# Patient Record
Sex: Female | Born: 1965 | Race: White | Hispanic: Yes | Marital: Single | Smoking: Never smoker
Health system: Southern US, Community
[De-identification: ages and names within clinical notes are randomized; demographics above are authoritative.]

---

## 2003-01-31 ENCOUNTER — Emergency Department (HOSPITAL_COMMUNITY): Admission: EM | Admit: 2003-01-31 | Discharge: 2003-01-31 | Payer: Self-pay | Admitting: Emergency Medicine

## 2003-07-08 ENCOUNTER — Emergency Department (HOSPITAL_COMMUNITY): Admission: EM | Admit: 2003-07-08 | Discharge: 2003-07-08 | Payer: Self-pay | Admitting: Emergency Medicine

## 2003-09-28 ENCOUNTER — Emergency Department (HOSPITAL_COMMUNITY): Admission: EM | Admit: 2003-09-28 | Discharge: 2003-09-28 | Payer: Self-pay | Admitting: Unknown Physician Specialty

## 2003-10-09 ENCOUNTER — Inpatient Hospital Stay (HOSPITAL_COMMUNITY): Admission: AD | Admit: 2003-10-09 | Discharge: 2003-10-09 | Payer: Self-pay | Admitting: Obstetrics & Gynecology

## 2003-10-25 ENCOUNTER — Emergency Department (HOSPITAL_COMMUNITY): Admission: EM | Admit: 2003-10-25 | Discharge: 2003-10-25 | Payer: Self-pay | Admitting: Emergency Medicine

## 2004-11-16 ENCOUNTER — Ambulatory Visit (HOSPITAL_COMMUNITY): Admission: RE | Admit: 2004-11-16 | Discharge: 2004-11-16 | Payer: Self-pay | Admitting: Internal Medicine

## 2004-12-14 ENCOUNTER — Ambulatory Visit (HOSPITAL_COMMUNITY): Admission: RE | Admit: 2004-12-14 | Discharge: 2004-12-14 | Payer: Self-pay | Admitting: Internal Medicine

## 2005-06-03 ENCOUNTER — Ambulatory Visit (HOSPITAL_COMMUNITY): Admission: RE | Admit: 2005-06-03 | Discharge: 2005-06-03 | Payer: Self-pay | Admitting: Internal Medicine

## 2006-07-31 ENCOUNTER — Emergency Department (HOSPITAL_COMMUNITY): Admission: EM | Admit: 2006-07-31 | Discharge: 2006-08-01 | Payer: Self-pay | Admitting: Emergency Medicine

## 2007-01-22 IMAGING — RF DG ESOPHAGUS
7 of 10 series · 15 of 24 positions shown · non-contrast
Comparison: None.

CLINICAL DATA: Dysphagia/vomiting.
 BARIUM SWALLOW:
TECHNIQUE: The patient was studied upright and flat.  Imaging includes three per second spot films in the cervical region in the AP and lateral projections.  She was also given a 13 mm tablet.   The patient has an acute back injury and so I could not get her to Valsalva.  There are no lesions of the esophagus.  No penetration or aspiration.  There is a probable small sliding hiatal hernia.  As mentioned, because of the patient?s back injury, I could not get her to Valsalva.  Even if this is a true abnormality, there is no reflux or esophagitis.  A 13 mm barium tablet readily traverses the GE junction. 
 Motility of the esophagus is normal.

[Series 1: run · 1 of 1 slices shown (1 of 7)]
[im 1/1]
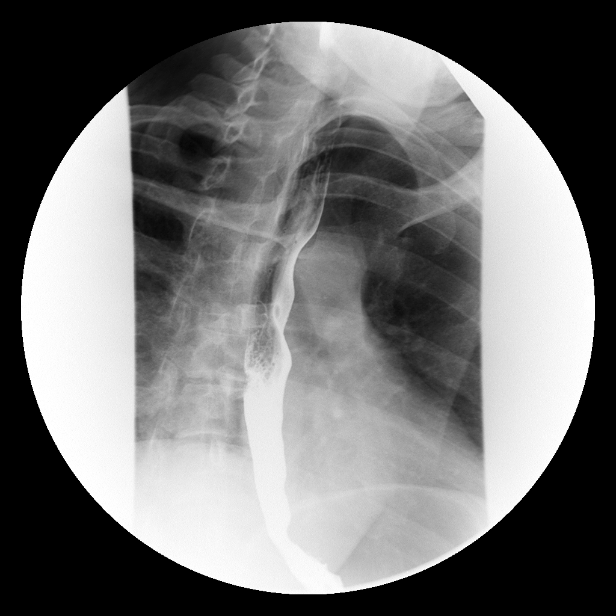

[Series 3: run · 6 of 12 slices shown (2 of 7)]
[im 1/12]
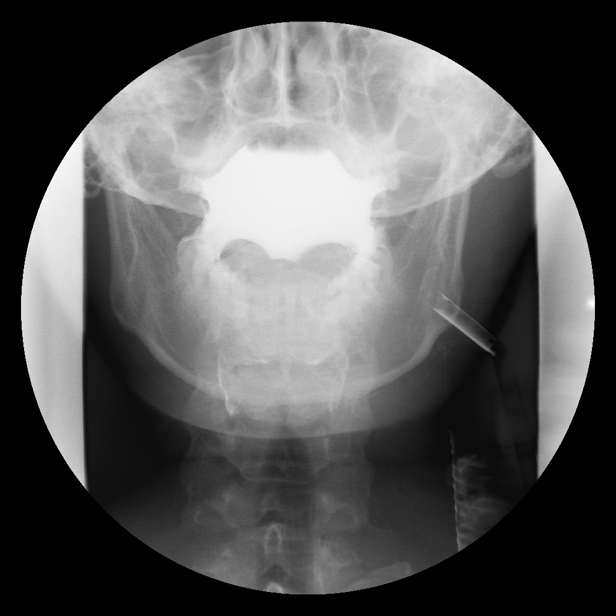
[im 3/12]
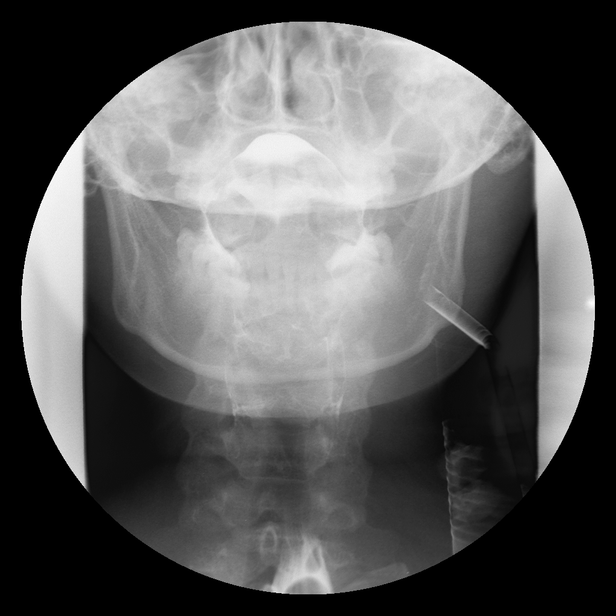
[im 4/12]
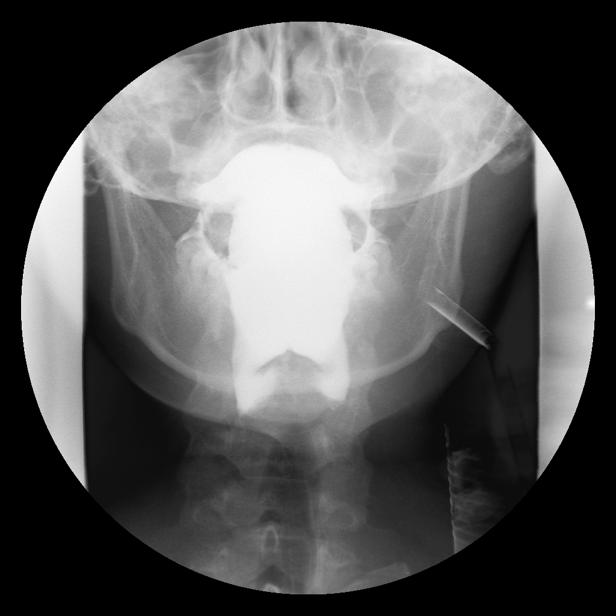
[im 7/12]
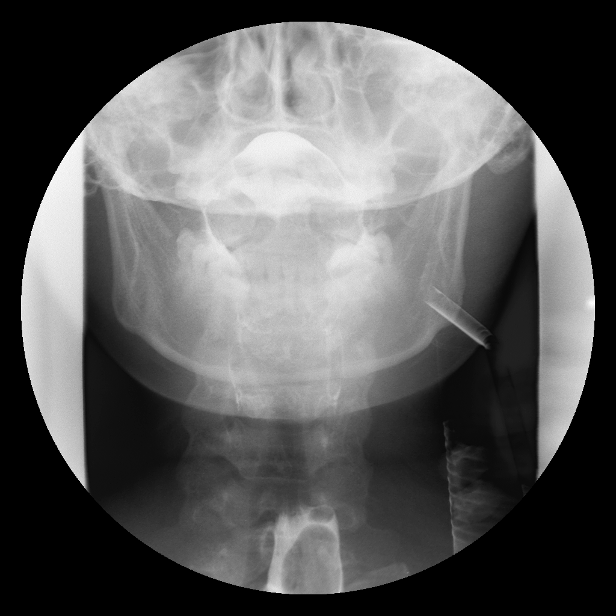
[im 8/12]
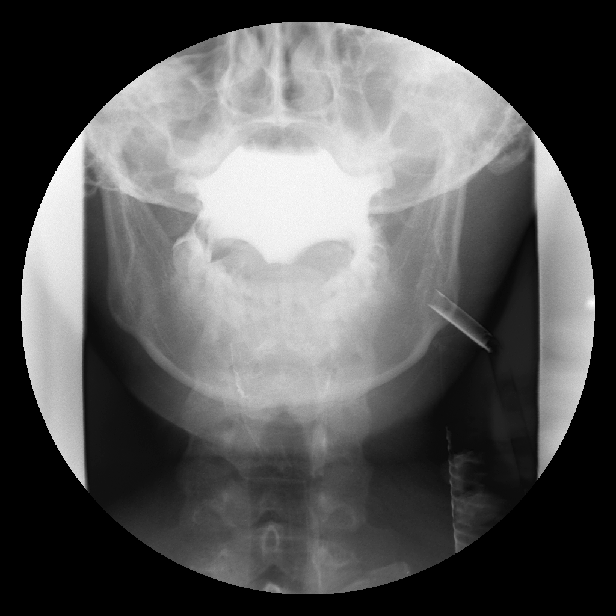
[im 10/12]
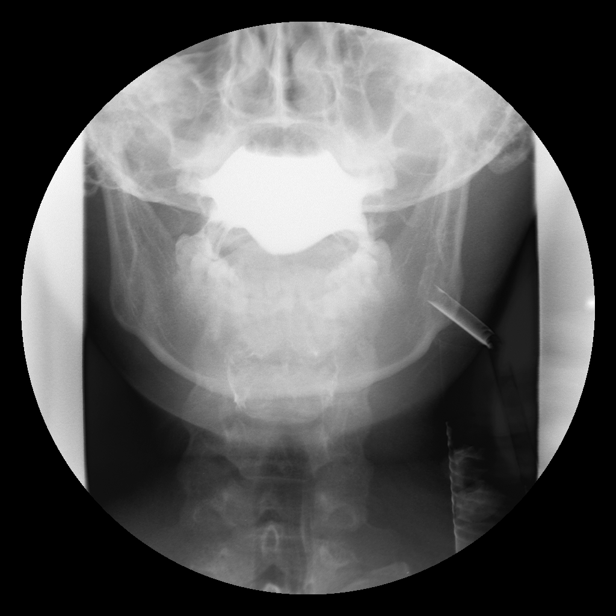

[Series 4: run · 4 of 6 slices shown (3 of 7)]
[im 1/6]
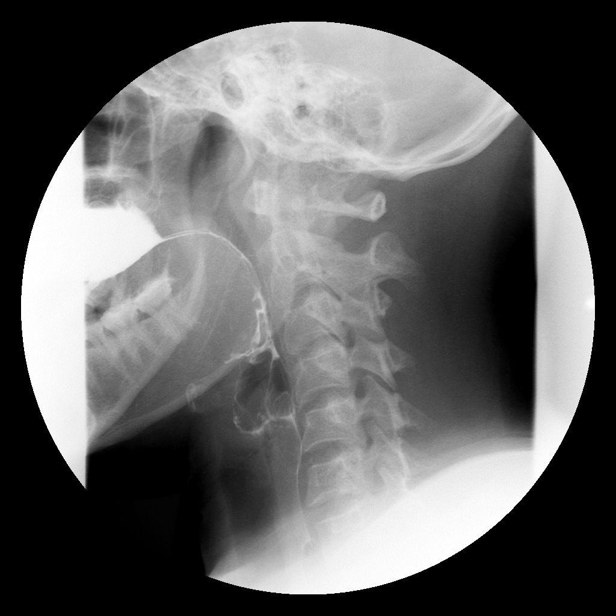
[im 2/6]
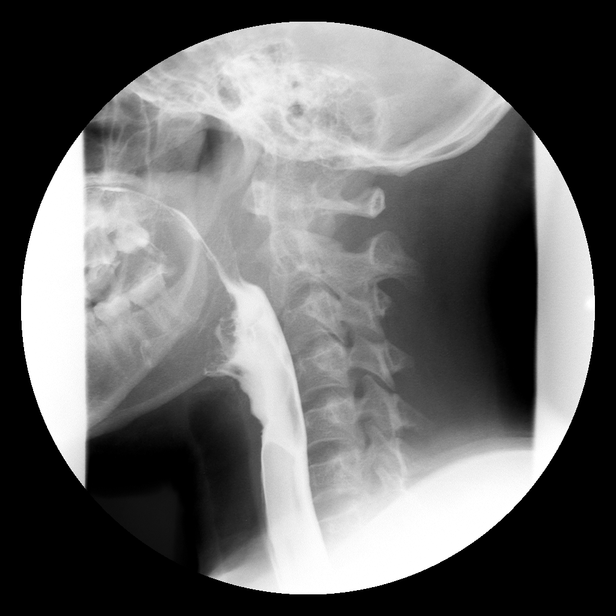
[im 4/6]
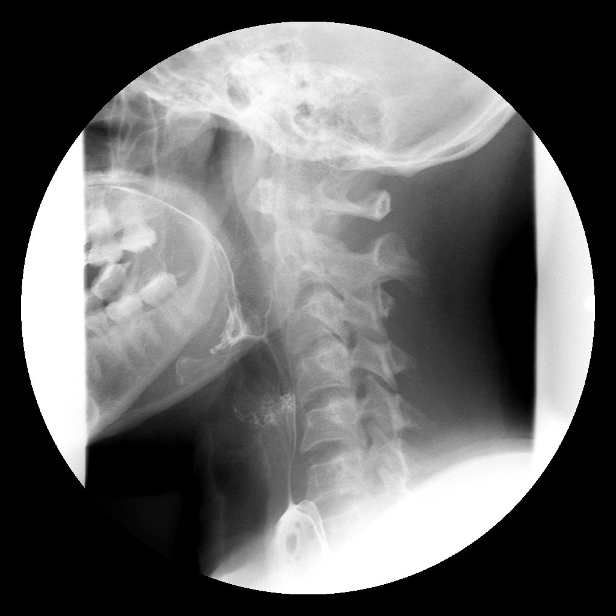
[im 5/6]
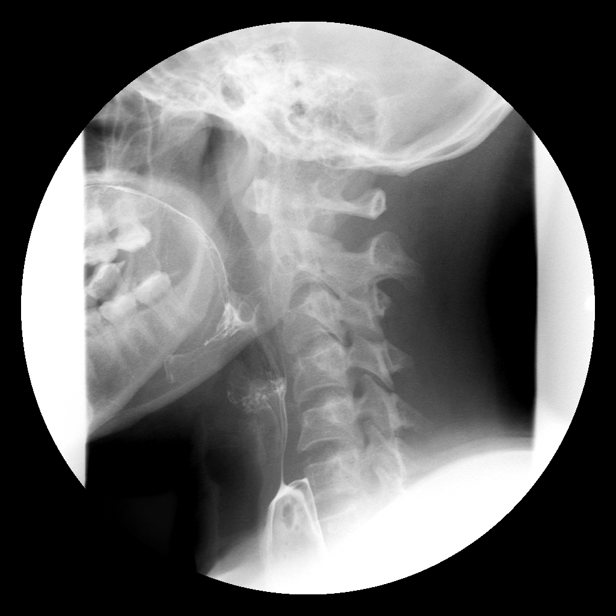

[Series 5: run · 1 of 1 slices shown (4 of 7)]
[im 1/1]
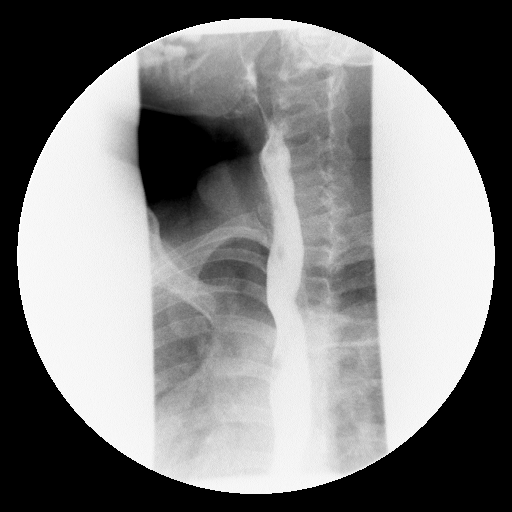

[Series 7: run · 1 of 1 slices shown (5 of 7)]
[im 1/1]
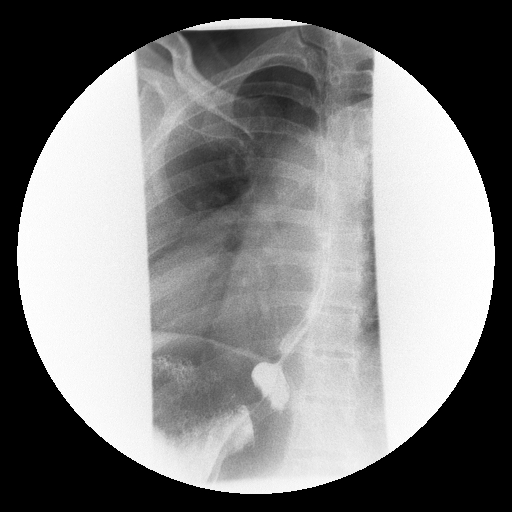

[Series 8: run · 1 of 1 slices shown (6 of 7)]
[im 1/1]
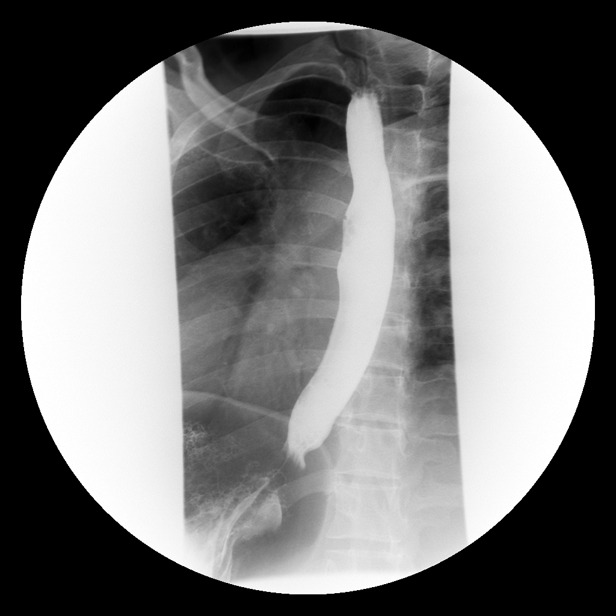

[Series 10: run · 1 of 1 slices shown (7 of 7)]
[im 1/1]
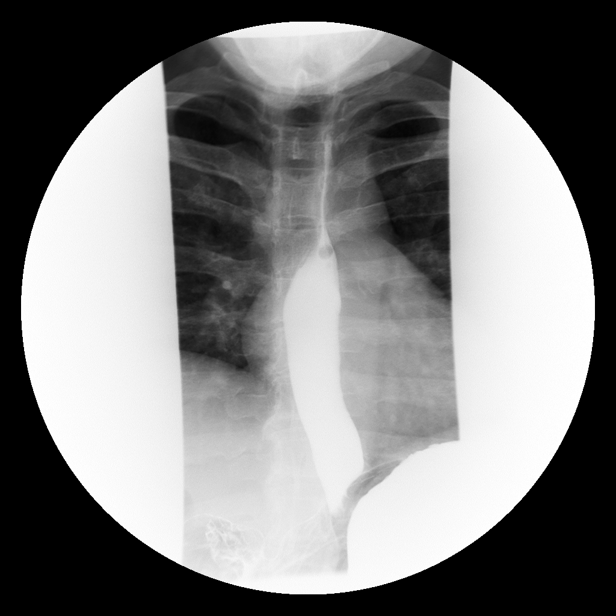

[15 of 24 positions shown; findings below may reference images not displayed]

IMPRESSION: Normal except for a possible small sliding hiatal hernia ? no reflux, esophagitis, or stricture.

## 2007-01-22 IMAGING — CR DG LUMBAR SPINE COMPLETE 4+V
5 series · 5 of 5 positions shown · non-contrast
Comparison: none

CLINICAL DATA: Nausea and vomiting.  Status post fall [REDACTED] with low back pain.
 LUMBAR LJO16-F VIEW:
 Contrast material is seen in small and large bowel compatible with patient?s esophagram earlier this same day.  This contrast partially obscures the vertebral bodies.  Vertebral body height and alignment appear maintained, however.  There is fracture of the coccyx bone.  No other abnormality.

[t l-spine a.p.]
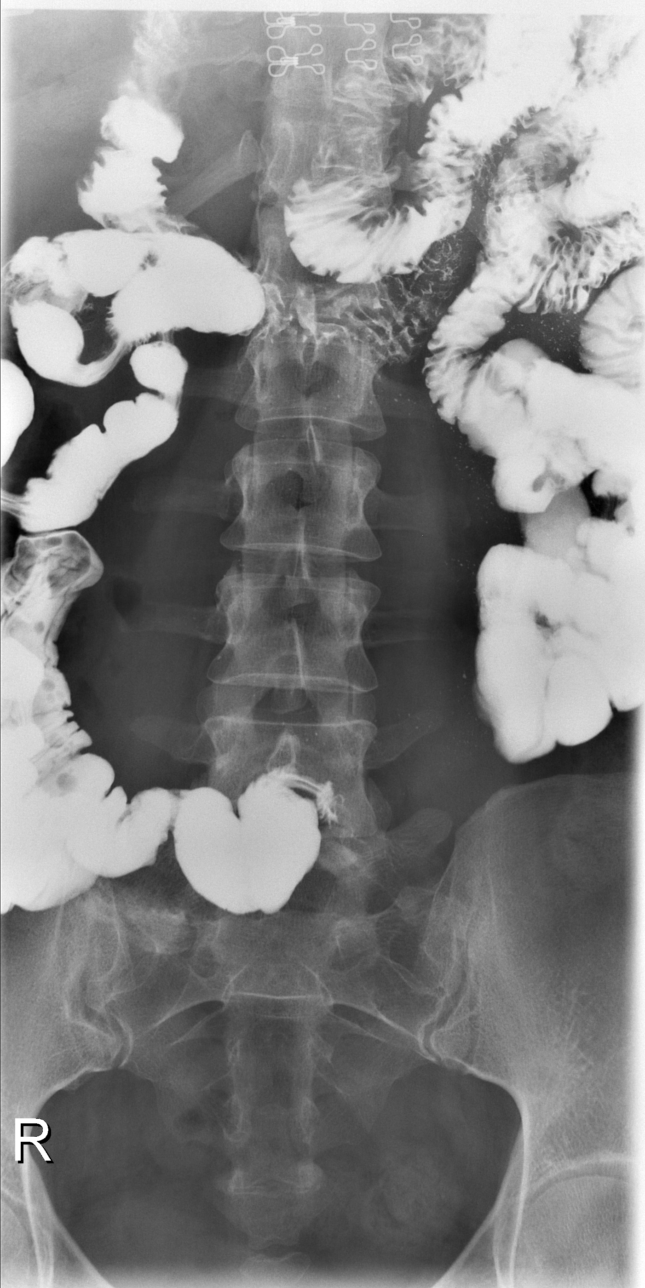

[t l-spine oblique exposure (1 of 2)]
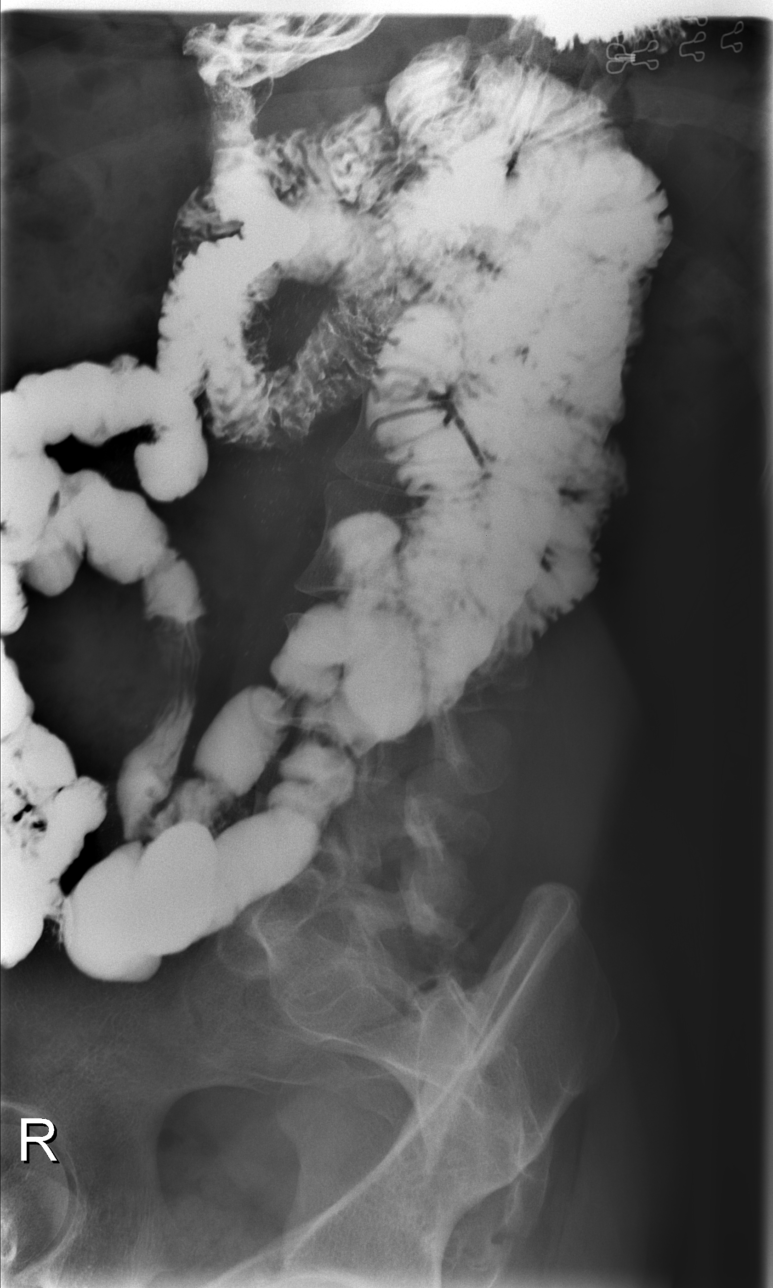

[t l-spine oblique exposure (2 of 2)]
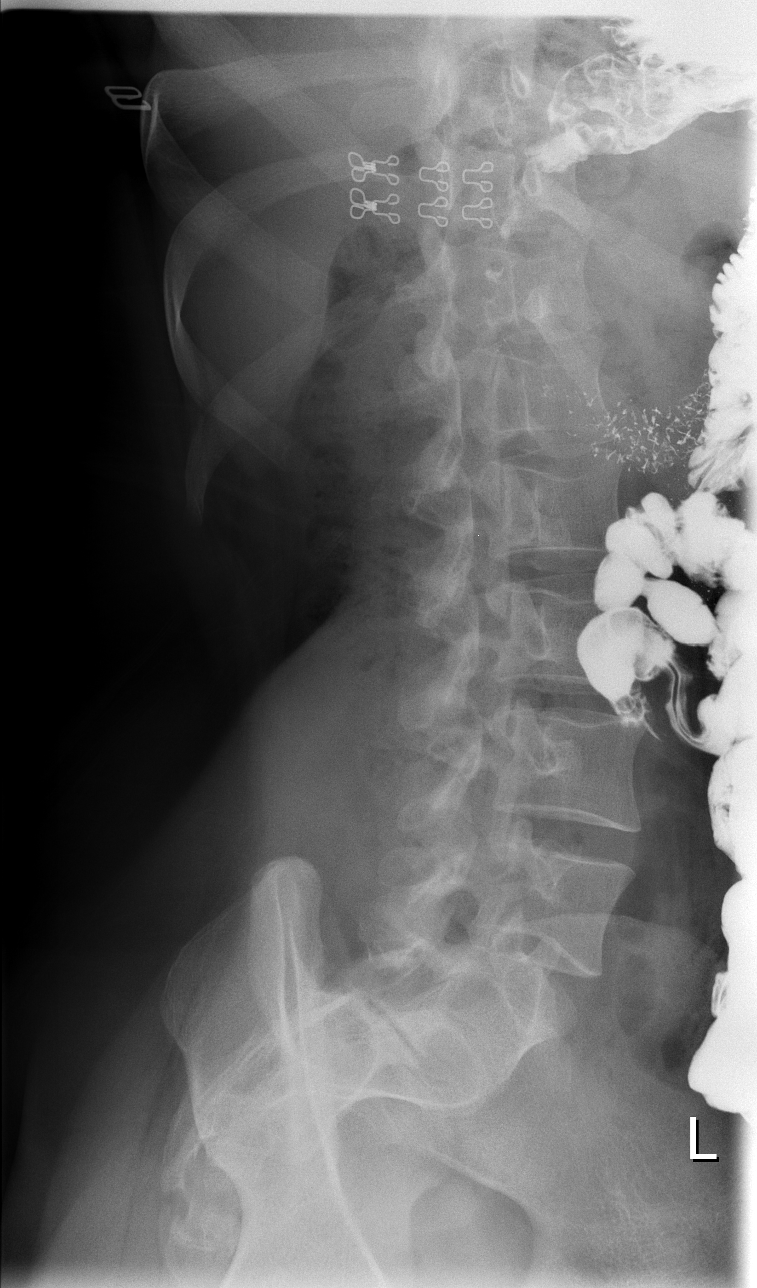

[t l-spine lat]
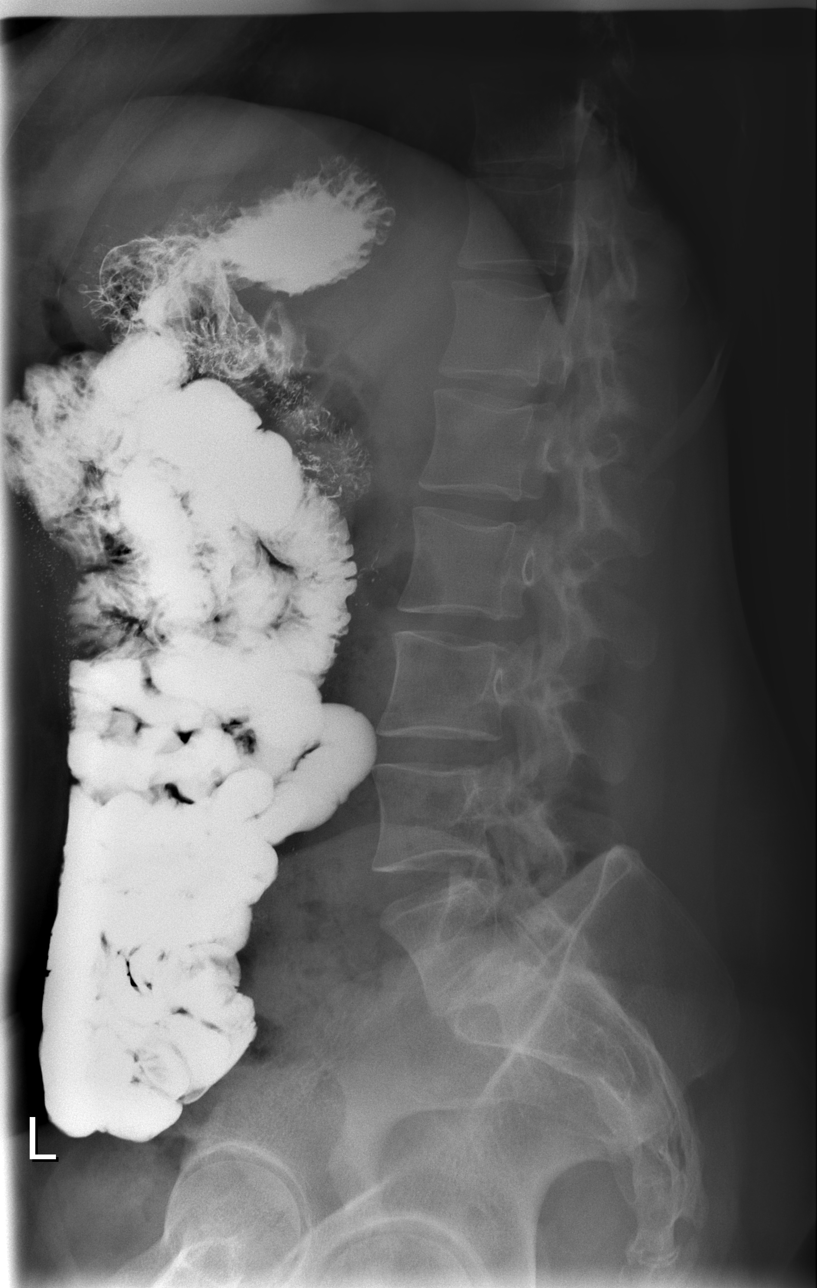

[t l-spine l5-s1 spot]
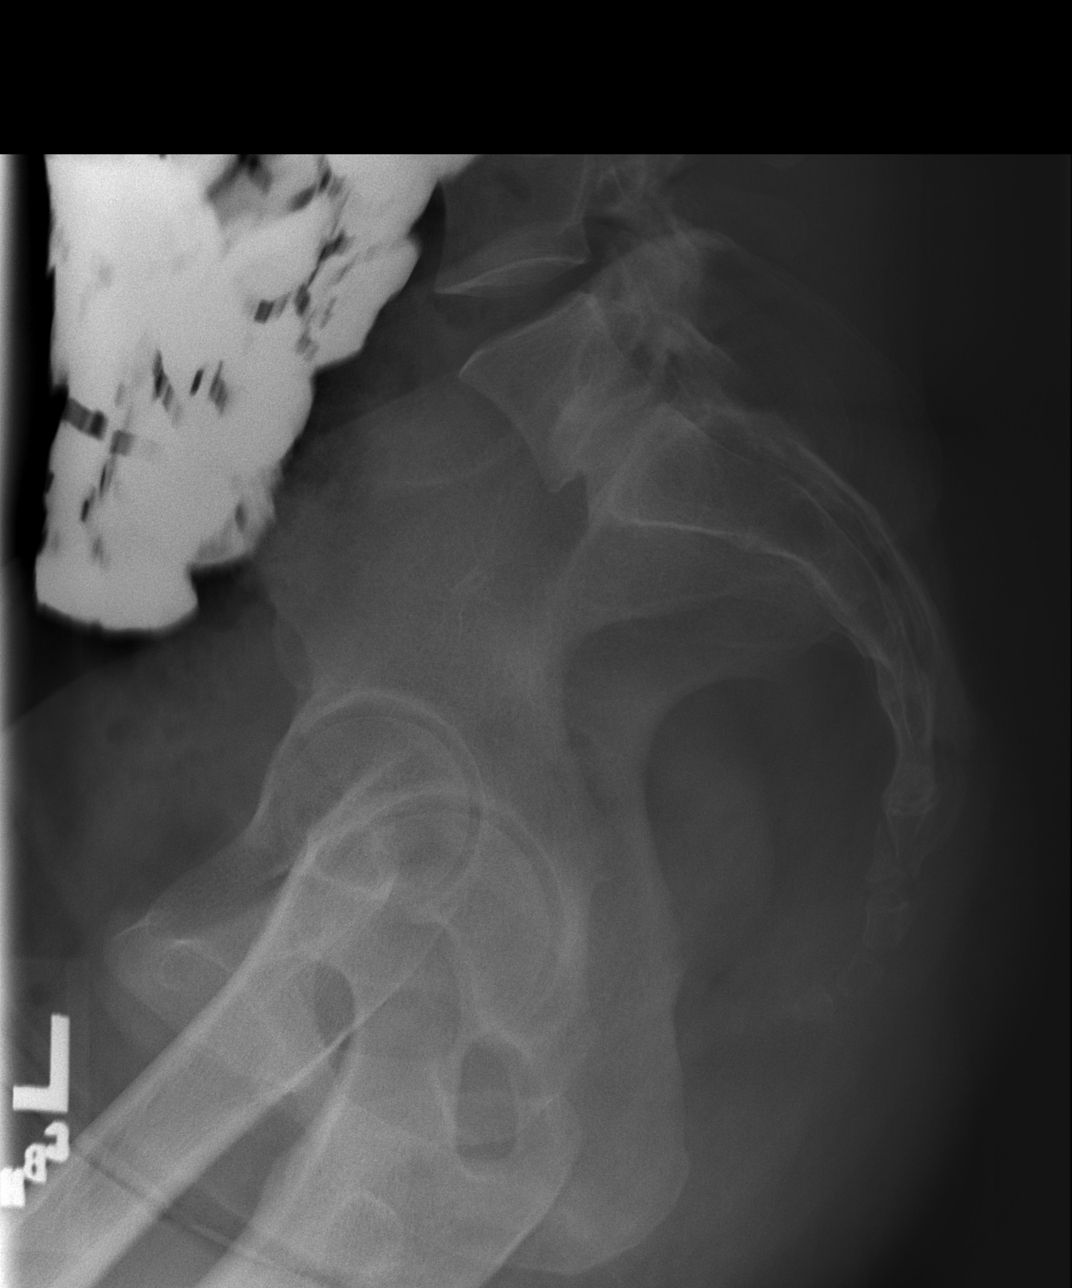

[5 of 5 positions shown; findings below may reference images not displayed]

IMPRESSION: Coccyx fracture.
 SACRUM AND COCCYX - 3 VIEW:
 The patient has fracture of the coccyx.  No other abnormality.
IMPRESSION: Coccyx fracture.

## 2008-02-06 ENCOUNTER — Encounter: Admission: RE | Admit: 2008-02-06 | Discharge: 2008-02-06 | Payer: Self-pay | Admitting: Gynecology

## 2008-02-06 ENCOUNTER — Ambulatory Visit: Payer: Self-pay | Admitting: Gynecology

## 2008-03-25 ENCOUNTER — Ambulatory Visit: Payer: Self-pay | Admitting: Gynecology

## 2008-05-14 ENCOUNTER — Ambulatory Visit: Payer: Self-pay | Admitting: Gynecology

## 2008-05-18 ENCOUNTER — Ambulatory Visit: Payer: Self-pay | Admitting: Gynecology

## 2008-05-18 ENCOUNTER — Ambulatory Visit (HOSPITAL_BASED_OUTPATIENT_CLINIC_OR_DEPARTMENT_OTHER): Admission: RE | Admit: 2008-05-18 | Discharge: 2008-05-19 | Payer: Self-pay | Admitting: Gynecology

## 2008-05-25 ENCOUNTER — Ambulatory Visit: Payer: Self-pay | Admitting: Women's Health

## 2008-06-02 ENCOUNTER — Ambulatory Visit: Payer: Self-pay | Admitting: Gynecology

## 2008-06-09 ENCOUNTER — Ambulatory Visit: Payer: Self-pay | Admitting: Gynecology

## 2008-07-20 ENCOUNTER — Ambulatory Visit: Payer: Self-pay | Admitting: Gynecology

## 2008-07-30 ENCOUNTER — Ambulatory Visit: Payer: Self-pay | Admitting: Gynecology

## 2008-08-25 ENCOUNTER — Ambulatory Visit: Payer: Self-pay | Admitting: Gynecology

## 2009-01-01 ENCOUNTER — Ambulatory Visit: Payer: Self-pay | Admitting: Gynecology

## 2010-08-22 LAB — CBC
HCT: 29.5 % — ABNORMAL LOW (ref 36.0–46.0)
Hemoglobin: 10.3 g/dL — ABNORMAL LOW (ref 12.0–15.0)
MCHC: 35 g/dL (ref 30.0–36.0)
RBC: 3.25 MIL/uL — ABNORMAL LOW (ref 3.87–5.11)
RDW: 13 % (ref 11.5–15.5)

## 2010-08-22 LAB — DIFFERENTIAL
Basophils Absolute: 0 10*3/uL (ref 0.0–0.1)
Basophils Relative: 0 % (ref 0–1)
Eosinophils Relative: 0 % (ref 0–5)
Monocytes Absolute: 0.8 10*3/uL (ref 0.1–1.0)
Monocytes Relative: 6 % (ref 3–12)
Neutro Abs: 9.8 10*3/uL — ABNORMAL HIGH (ref 1.7–7.7)

## 2010-09-20 NOTE — Op Note (Signed)
Evelyn Rodgers, Evelyn Rodgers       ACCOUNT NO.:  [id number]   MEDICAL RECORD NO.:  [id number]          PATIENT TYPE:  AMB   LOCATION:  [facility]                         FACILITY:  [facility]   PHYSICIAN:  Juan H. Lily Peer, M.D.DATE OF BIRTH:  Jan 14, 1966   DATE OF PROCEDURE:  DATE OF DISCHARGE:                               OPERATIVE REPORT   FIRST ASSISTANT:  Timothy P. Fontaine, M.D.   INDICATIONS FOR OPERATION:  A 45 year old gravida 3, para 2, AB 1 with  symptomatic second-degree cystocele and second-degree rectocele and  pelvic pressure.   PREOPERATIVE DIAGNOSES:  1. Second-degree cystocele.  2. Second-degree rectocele.   ANESTHESIA:  General endotracheal anesthesia.   FINDINGS:  Second-degree cystocele and second-degree rectocele and mild  pelvic relaxation.   PROCEDURE PERFORMED:  Anterior colporrhaphy and posterior colporrhaphy.   DESCRIPTION OF OPERATION:  After the patient was adequately counseled,  she was taken to the operating room where she underwent a successful  general endotracheal anesthesia.  She was placed in high lithotomy  position.  PSA stockings were in place for DVT prophylaxis.  The vagina  and perineum were prepped and draped in usual sterile fashion.  A Foley  catheter had been placed to monitor urinary output as well as to  determine urethrovesical angle.  A weighted speculum was placed in the  posterior vaginal vault, and a transverse-shaped incision was made in  the vaginal mucosa near the apex.  Two Allis clamps were placed for  traction, and the third Allis clamp was placed about 1 cm below the  posterior margin of the urethral meatus and pulled down.  Hemostatic  solution such as 0.5% lidocaine with 1:200,000 epinephrine was  infiltrated submucosally along the midline of the anterior vaginal wall  to decrease bleeding and to aid in dissection.  The points of a pair of  Strully scissors were used and inserted between the vaginal epithelium  in the  vaginal muscularis.  The vagina was then incised at the midline  of the incision, continued to the level of mid-urethra.  As the vagina  was incised, the edges were grasped with Allis clamps and drawn  laterally for further mobilization.  Dissection of the vaginal flaps was  then accomplished by turning the clamps back across the forefinger and  incising the vascular muscularis with a scalpel and Metzenbaum scissors  along with applying constant traction medially on the remaining vaginal  muscularis and underlying vesicovaginal adventitia.  The procedure was  performed bilaterally and a study to entire extent of the anterior  vaginal prolapse had been dissected.  The spaces lateral to the  urethrovesical junction were sharply dissected towards ichiopubic rami.  Once the vaginal flaps were completely developed, the urethrovesical  junction was identified by pulling on the Foley catheter downward until  bulb obstruct the vesicle neck.  Repair began at the urethral vesicle  junction with 2-0 Vicryl suture.  The first plicating stitch was placed  into the periurethral endopelvic fascia and tied, and 2 additional  stitches were performed at the length of the urethra and urethrovesical  junction.  After the stitches of the vesicle neck plication were placed  and tied, attention was then turned to the anterior vaginal prolapse in  a standard anterior colporrhaphy stitches using 2-0 Vicryl suture were  placed in the vaginal tissue (muscularis and adventitia) medial to the  vaginal flaps and plicated in the midline without tension.  A single row  of stitches was all that required.  The excess vaginal epithelium was  then trimmed from the flaps bilaterally, and the remaining anterior  vaginal wall was closed with a running stitch of 0-Vicryl suture.  Attention was then placed for the posterior colporrhaphy.  The weighted  speculum was removed.  The triangular incision was made into the  peritoneal  skin and the base of the triangle at the hymen.  The skin was  dissected away from the perineal body.  The vaginal epithelium was  opened in the midline extending incision to the vaginal apex.  The  posterior vaginal epithelium was dissected bilaterally away from the  underlying fiber muscularis and extended to the levators.  The posterior  vaginal wall was stripped off at the epithelium, was plicated in the  midline with interrupted sutures of 2-0 Vicryl suture incorporating the  generous purchase of the fiber muscularis.  Plication began proximally  and progressed to the hymenal ring.  The vaginal epithelium excess  tissue was excised, and the vaginal mucosa was closed with a running  stitch of 0-Vicryl suture.  The vagina was copiously irrigated with  normal saline solution. Kerlix gauze was placed into the vaginal vault  which was impregnated with Estrace cream.  The patient was extubated,  transferred to recovery with stable vital signs.  Blood loss from  procedure was recorded at 400 cc.  IV fluid of 1000 cc of lactated  Ringer's, and she did receive a gram of Cefotan, and had PSA stockings  for DVT prophylaxis.      Juan H. Lily Peer, M.D.  Electronically Signed     JHF/MEDQ  D:  05/18/2008  T:  05/19/2008  Job:  [id number]

## 2010-09-20 NOTE — H&P (Signed)
Evelyn Rodgers, Evelyn Rodgers       ACCOUNT NO.:  [id number]   MEDICAL RECORD NO.:  [id number]         PATIENT TYPE:  HAMB   LOCATION:                               FACILITY:  [facility]   PHYSICIAN:  Juan H. Lily Peer, M.D.DATE OF BIRTH:  April 18, 1966   DATE OF ADMISSION:  05/18/2008  DATE OF DISCHARGE:                              HISTORY & PHYSICAL    The patient is scheduled for surgery on Monday, January 11th at 7:30  a.m. at [facility].  Please have history and physical.   CHIEF COMPLAINT:  Second-degree cystocele, second-degree rectocele, and  pelvic pressure,.   HISTORY OF PRESENT ILLNESS:  The patient is a 45 year old gravida 3,  para 2, AB 1 who was seen in the office on October 1st as a new patient.  She had been receive her gynecological care at the [facility].  She had complaints of sensation of something falling out of  her vagina.  She denied any stress incontinence, although occasionally  she had urgency.   On examination, she was noted to have a first-to- second-degree  cystocele, second-degree rectocele, and some mild uterine descensus.  This was confirmed while the patient was in the erect position.  On  rectal examination, there appeared to be no evidence any enterocele.  We  had discussed alternative treatments for pelvic relaxation and diagram  and literature information was provided.  She was adamant of proceeding  with hysterectomy and wants to proceed with a cystocele and rectocele  repair.  She had been fully aware that if she would get pregnant in the  future that the repair could be affected and she would need another  operation.  She fully understands and accepts.   PAST MEDICAL HISTORY:  The patient has had 2 vaginal deliveries, one  miscarriage with a D&C.   ALLERGIES:  She denies any allergies.   MEDICATIONS:  She is not using any form of contraception.  Not taking  any medications.   FAMILY HISTORY:  She denies any  family history of any malignancies.   SOCIAL HISTORY:  She has 8 brothers and 3 sisters.   PHYSICAL EXAMINATION:  VITAL SIGNS:  The patient weighs 158 pounds.  She  is 5 feet 2 inches tall.  Blood pressure 118/74.  HEENT:  Unremarkable.  NECK:  Supple.  Trachea midline.  No carotid bruits.  No thyromegaly.  LUNGS:  Clear to auscultation without rhonchi or wheezes.  HEART:  Regular rate and rhythm.  No murmurs or gallop.  BREAST:  Unremarkable.  ABDOMEN:  Soft and nontender.  No rebound or guarding.  PELVIC:  Bartholin, urethra, and Skene's are within normal limits.  VAGINA:  First-to- second-degree cystocele, second-degree rectocele, and  mild uterine descensus, no enterocele.   ASSESSMENT:  A 45 year old gravida 3, para 2, AB 1, complaining of  pressure sensation like something was falling out of her vagina.  The  patient is with a first-to- second-degree cystocele, second-degree  rectocele, and mild uterine descensus, and no evidence of enterocele.  The patient is wishing to keep her uterus despite her counseling at the  age of 35.  She would like to proceed with just an anterior and  posterior colporrhaphy.  The risks, benefits, and pros and cons of the  operation were discussed to include bladder injury, infection, bleeding,  trauma to internal organs, the need for wearing a leg bag if there is  any bladder injury from her surgery needing repair, and also the event  in the near future that she were to get pregnant, that the repair could  become undone and she would need another repairing in the future.  The  patient is fully aware of this and accepts.  All the above was explained  in Spanish as well as information was given in written format.  All  questions were answered and we will follow accordingly.  The patient is  scheduled for anterior and posterior colporrhaphy on Monday, January  11th at 7:30 a.m. at [facility].  Please have history  and physical  available.      Juan H. Lily Peer, M.D.  Electronically Signed     JHF/MEDQ  D:  05/15/2008  T:  05/16/2008  Job:  [id number]

## 2013-06-14 ENCOUNTER — Emergency Department (HOSPITAL_COMMUNITY)
Admission: EM | Admit: 2013-06-14 | Discharge: 2013-06-15 | Disposition: A | Discharge status: Home / Self Care | Payer: No Typology Code available for payment source | Attending: Emergency Medicine | Admitting: Emergency Medicine

## 2013-06-14 ENCOUNTER — Other Ambulatory Visit: Payer: Self-pay

## 2013-06-14 ENCOUNTER — Encounter (HOSPITAL_COMMUNITY): Payer: Self-pay | Admitting: Emergency Medicine

## 2013-06-14 DIAGNOSIS — R42 Dizziness and giddiness: Secondary | ICD-10-CM | POA: Insufficient documentation

## 2013-06-14 DIAGNOSIS — R634 Abnormal weight loss: Secondary | ICD-10-CM | POA: Insufficient documentation

## 2013-06-14 DIAGNOSIS — R5383 Other fatigue: Secondary | ICD-10-CM

## 2013-06-14 DIAGNOSIS — Z3202 Encounter for pregnancy test, result negative: Secondary | ICD-10-CM | POA: Insufficient documentation

## 2013-06-14 DIAGNOSIS — R5381 Other malaise: Secondary | ICD-10-CM | POA: Insufficient documentation

## 2013-06-14 DIAGNOSIS — R112 Nausea with vomiting, unspecified: Secondary | ICD-10-CM | POA: Insufficient documentation

## 2013-06-14 LAB — CBC
HCT: 39.6 % (ref 36.0–46.0)
Hemoglobin: 14 g/dL (ref 12.0–15.0)
MCH: 31 pg (ref 26.0–34.0)
MCHC: 35.4 g/dL (ref 30.0–36.0)
MCV: 87.6 fL (ref 78.0–100.0)
PLATELETS: 245 10*3/uL (ref 150–400)
RBC: 4.52 MIL/uL (ref 3.87–5.11)
RDW: 12.6 % (ref 11.5–15.5)
WBC: 8.4 10*3/uL (ref 4.0–10.5)

## 2013-06-14 LAB — BASIC METABOLIC PANEL
BUN: 16 mg/dL (ref 6–23)
CHLORIDE: 100 meq/L (ref 96–112)
CO2: 25 meq/L (ref 19–32)
CREATININE: 0.62 mg/dL (ref 0.50–1.10)
Calcium: 9.8 mg/dL (ref 8.4–10.5)
GFR calc Af Amer: >90 mL/min (ref >90)
GFR calc non Af Amer: >90 mL/min (ref >90)
Glucose, Bld: 107 mg/dL — ABNORMAL HIGH (ref 70–99)
Potassium: 3.6 mEq/L — ABNORMAL LOW (ref 3.7–5.3)
Sodium: 138 mEq/L (ref 137–147)

## 2013-06-14 MED ORDER — ONDANSETRON HCL 4 MG/2ML IJ SOLN
4.0000 mg | Freq: Once | INTRAMUSCULAR | Status: AC
  Administered 2013-06-15: 4 mg via INTRAVENOUS
  Filled 2013-06-14: qty 2

## 2013-06-14 MED ORDER — PANTOPRAZOLE SODIUM 40 MG IV SOLR
40.0000 mg | Freq: Once | INTRAVENOUS | Status: AC
  Administered 2013-06-15: 40 mg via INTRAVENOUS
  Filled 2013-06-14: qty 40

## 2013-06-14 NOTE — ED Notes (Signed)
X 1 week of chest tightness. Feels like it is beating fast.

## 2013-06-14 NOTE — ED Notes (Addendum)
Patient here with complaint of fluttering feeling in chest and some tightness earlier. Also explains that she has been sick for approximately 2 weeks with vomiting and inability to tolerate anything beyond fruit juices, but states that she was able to eat some food today without vomiting. Endorses fever and chills when the symptoms started 2 weeks ago and chills earlier today, but has since felt no more. Endorses no history of heart problems.

## 2013-06-15 ENCOUNTER — Emergency Department (HOSPITAL_COMMUNITY): Payer: No Typology Code available for payment source

## 2013-06-15 LAB — HEPATIC FUNCTION PANEL
ALBUMIN: 3.9 g/dL (ref 3.5–5.2)
ALT: 12 U/L (ref 0–35)
AST: 14 U/L (ref 0–37)
Alkaline Phosphatase: 45 U/L (ref 39–117)
Bilirubin, Direct: <0.2 mg/dL (ref 0.0–0.3)
TOTAL PROTEIN: 7.6 g/dL (ref 6.0–8.3)
Total Bilirubin: 0.4 mg/dL (ref 0.3–1.2)

## 2013-06-15 LAB — LIPASE, BLOOD: LIPASE: 27 U/L (ref 11–59)

## 2013-06-15 LAB — URINALYSIS, ROUTINE W REFLEX MICROSCOPIC
Bilirubin Urine: NEGATIVE
Glucose, UA: NEGATIVE mg/dL
Ketones, ur: NEGATIVE mg/dL
LEUKOCYTES UA: NEGATIVE
NITRITE: NEGATIVE
PH: 7 (ref 5.0–8.0)
Protein, ur: NEGATIVE mg/dL
SPECIFIC GRAVITY, URINE: 1.027 (ref 1.005–1.030)
Urobilinogen, UA: 0.2 mg/dL (ref 0.0–1.0)

## 2013-06-15 LAB — URINE MICROSCOPIC-ADD ON

## 2013-06-15 LAB — PREGNANCY, URINE: PREG TEST UR: NEGATIVE

## 2013-06-15 MED ORDER — ONDANSETRON 8 MG PO TBDP: 8.0000 mg | ORAL_TABLET | Freq: Three times a day (TID) | ORAL | Status: DC | PRN

## 2013-06-15 MED ORDER — IOHEXOL 300 MG/ML  SOLN
20.0000 mL | INTRAMUSCULAR | Status: AC
  Administered 2013-06-15: 25 mL via ORAL

## 2013-06-15 MED ORDER — IOHEXOL 300 MG/ML  SOLN
100.0000 mL | Freq: Once | INTRAMUSCULAR | Status: AC | PRN
  Administered 2013-06-15: 100 mL via INTRAVENOUS

## 2013-06-15 MED ORDER — PANTOPRAZOLE SODIUM 20 MG PO TBEC: 40.0000 mg | DELAYED_RELEASE_TABLET | Freq: Every day | ORAL | Status: DC

## 2013-06-15 NOTE — Discharge Instructions (Signed)
Your workup today has not shown a specific cause for your symptoms.  Your EKG and lab work were all normal.  Your CAT scan did not show any signs of cancer, obstruction, or other abnormalities.  Take the medications as prescribed.  Please follow up with your doctor for further workup.  You may need referral to a gastroenterologist.  Return to the ER for worsening condition or new concerning symptoms.  Su estudio diagnstico hoy no ha mostrado una causa especfica para sus sntomas. Su electrocardiograma y ARAMARK Corporationanlisis de laboratorio fueron normales. Su TAC no mostr ningn signo de cncer, obstruccin y Charity fundraiserotras anomalas. Tome los medicamentos segn las indicaciones. Por favor, el seguimiento con su mdico para ms estudio diagnstico. Es posible que necesite la derivacin a Futures traderun gastroenterlogo. Regreso a la sala de urgencias por empeoramiento de la enfermedad o nuevos sntoma preocupante. Nuseas y Vmitos (Nausea and Vomiting) La nusea es la sensacin de Dentistmalestar en el estmago o de la necesidad de vomitar. El vmito es un reflejo por el que los contenidos del estmago salen por la boca. El vmito puede ocasionar prdida de lquidos del organismo (deshidratacin). Los nios y los ONEOKadultos mayores pueden deshidratarse rpidamente (en especial si tambin tienen diarrea). Las nuseas y los vmitos son sntoma de un trastorno o enfermedad. Es importante Emergency planning/management officeraveriguar la causa de los sntomas. CAUSAS  Irritacin directa de la membrana que cubre el [place]mago. Esta irritacin puede ser resultado del aumento de la produccin de cido, (reflujo gastroesofgico), infecciones, intoxicacin alimentaria, ciertos medicamentos (como antinflamatorios no esteroideos), consumo de alcohol o de tabaco.  Seales del cerebro.Estas seales pueden ser un dolor de cabeza, exposicin al calor, trastornos del odo interno, aumento de la presin en el cerebro por lesiones, infeccin, un tumor o conmocin cerebral, estmulos emocionales o  problemas metablicos.  Una obstruccin en el tracto gastrointestinal (obstruccin intestinal).  Ciertas enfermedades como la diabetes, problemas en la vescula biliar, apendicitis, problemas renales, cncer, sepsis, sntomas atpicos de infarto o trastornos alimentarios.  Tratamientos mdicos como la quimioterapia y la radiacin.  Medicamentos que inducen al sueo (anestesia general) durante Cipriano Mileuna ciruga. DIAGNSTICO  El mdico podr solicitarle algunos anlisis si los problemas no mejoran luego de 2601 Dimmitt Roadalgunos das. Tambin podrn pedirle anlisis si los sntomas son graves o si el motivo de los vmitos o las nuseas no est claro. Los American Electric Poweranlisis pueden ser:   Anlisis de [place].  Anlisis de [place].  Pruebas de materia fecal.  Cultivos (para buscar evidencias de infeccin).  Radiografas u otros estudios por imgenes. Los Norfolk Southernresultados de las pruebas lo ayudarn al mdico a tomar decisiones acerca del mejor curso de tratamiento o la necesidad de Consecoanlisis adicionales.  TRATAMIENTO  Debe estar bien hidratado. Beba con frecuencia pequeas cantidades de lquido.Puede beber agua, bebidas deportivas, caldos claros o comer pequeos trocitos de hielo o gelatina para mantenerse hidratado.Cuando coma, hgalo lentamente para evitar las nuseas.Hay medicamentos para evitar las nuseas que pueden aliviarlo.  INSTRUCCIONES PARA EL CUIDADO DOMICILIARIO  Si su mdico le prescribe medicamentos tmelos como se le haya indicado.  Si no tiene hambre, no se fuerce a comer. Sin embargo, es necesario que tome lquidos.  Si tiene hambre alimntese con una dieta normal, a menos que el mdico le indique otra cosa.  Los mejores alimentos son Neomia Dearuna combinacin de carbohidratos complejos (arroz, trigo, papas, pan), carnes magras, yogur, frutas y Sports administratorvegetales.  Evite los alimentos ricos en grasas porque dificultan la digestin.  Beba gran cantidad de lquido para mantener la orina de [place]o claro  o color amarillo  plido.  Si est deshidratado, consulte a su mdico para que le d instrucciones especficas para volver a hidratarlo. Los signos de deshidratacin son:  Franz Dell sed.  Labios y boca secos.  Mareos.  [place].  Disminucin de la frecuencia y cantidad de la [place].  Confusin.  Tiene el pulso o la respiracin acelerados. SOLICITE ATENCIN MDICA DE INMEDIATO SI:  Vomita sangre o algo similar a la borra del caf.  La materia fecal (heces) es negra o tiene [place].  Sufre una cefalea grave o rigidez en el cuello.  Se siente confundido.  Siente dolor abdominal intenso.  Tiene dolor en el pecho o dificultad para respirar.  No orina por 8 horas.  Tiene la piel fra y pegajosa.  Sigue vomitando durante ms de 24 a 48 horas.  Tiene fiebre. ASEGRESE QUE:   Comprende estas instrucciones.  Controlar su enfermedad.  Solicitar ayuda inmediatamente si no mejora o si empeora. Document Released: 05/14/2007 Document Revised: 07/17/2011 [facility] Patient Information 2014 [place], [place].

## 2013-06-15 NOTE — ED Notes (Signed)
Called CT to inform of patient completing oral contrast.

## 2013-06-15 NOTE — ED Provider Notes (Signed)
CSN: [id number]     Arrival date & time 06/14/13  2204 History   First MD Initiated Contact with Patient 06/14/13 2301     Chief Complaint  Patient presents with  . Chest Pain   (Consider location/radiation/quality/duration/timing/severity/associated sxs/prior Treatment) HPI A 48 year old female presents to emergency department from home with complaint of onset of weakness and dizziness and sense of heart palpitations started today around 4 PM.  She reports over the last 2 weeks, anytime she tries take solid foods she has vomiting.  She reports burning that persists after her episodes of vomiting.  She is able to drink fruit and vegetable smoothies without difficulties.  Patient denies any shortness of breath, no chest pain.  Patient [place] who she has not seen for these problems.  She reports that she has had an 8 pound weight loss.  No prior history of dysphasia or vomiting in the past.  She is on no medications, and denies any medical problems. History reviewed. No pertinent past medical history. History reviewed. No pertinent past surgical history. History reviewed. No pertinent family history. History  Substance Use Topics  . Smoking status: Never Smoker   . Smokeless tobacco: Not on file  . Alcohol Use: No   OB History   Grav Para Term Preterm Abortions TAB SAB Ect Mult Living                 Review of Systems  See History of Present Illness; otherwise all other systems are reviewed and negative Allergies  Review of patient's allergies indicates no known allergies.  Home Medications   Current Outpatient Rx  Name  Route  Sig  Dispense  Refill  . ibuprofen (ADVIL,MOTRIN) 200 MG tablet   Oral   Take 200-400 mg by mouth every 6 (six) hours as needed.          BP 102/62  Pulse 65  Temp(Src) 97.4 F (36.3 C) (Oral)  Resp 14  Ht 5\' 2"  (1.575 m)  Wt 162 lb (73.483 kg)  BMI 29.62 kg/m2  SpO2 100%  LMP 05/14/2013 Physical  Exam  Nursing note and vitals reviewed. Constitutional: She is oriented to person, place, and time. She appears well-developed and well-nourished. No distress.  HENT:  Head: Normocephalic and atraumatic.  Nose: Nose normal.  Mouth/Throat: Oropharynx is clear and moist.  Eyes: Conjunctivae and EOM are normal. Pupils are equal, round, and reactive to light.  Neck: Normal range of motion. Neck supple. No JVD present. No tracheal deviation present. No thyromegaly present.  Cardiovascular: Normal rate, regular rhythm, normal heart sounds and intact distal pulses.  Exam reveals no gallop and no friction rub.   No murmur heard. Pulmonary/Chest: Effort normal and breath sounds normal. No stridor. No respiratory distress. She has no wheezes. She has no rales. She exhibits no tenderness.  Abdominal: Soft. Bowel sounds are normal. She exhibits no distension and no mass. There is no tenderness. There is no rebound and no guarding.  Musculoskeletal: Normal range of motion. She exhibits no edema and no tenderness.  Lymphadenopathy:    She has no cervical adenopathy.  Neurological: She is alert and oriented to person, place, and time. She exhibits normal muscle tone. Coordination normal.  Skin: Skin is warm and dry. No rash noted. No erythema. No pallor.  Psychiatric: She has a normal mood and affect. Her behavior is normal. Judgment and thought content normal.    ED Course  Procedures (including critical  care time) Labs Review Labs Reviewed  BASIC METABOLIC PANEL - Abnormal; Notable for the following:    Potassium 3.6 (*)    Glucose, Bld 107 (*)    All other components within normal limits  URINALYSIS, ROUTINE W REFLEX MICROSCOPIC - Abnormal; Notable for the following:    Color, Urine STRAW (*)    Hgb urine dipstick TRACE (*)    All other components within normal limits  URINE MICROSCOPIC-ADD ON - Abnormal; Notable for the following:    Squamous Epithelial / LPF FEW (*)    All other components  within normal limits  CBC  PREGNANCY, URINE  HEPATIC FUNCTION PANEL  LIPASE, BLOOD   Imaging Review Ct Abdomen Pelvis W Contrast  06/15/2013   CLINICAL DATA:  48 year old female with abdominal and pelvic pain and vomiting.  EXAM: CT ABDOMEN AND PELVIS WITH CONTRAST  TECHNIQUE: Multidetector CT imaging of the abdomen and pelvis was performed using the standard protocol following bolus administration of intravenous contrast.  CONTRAST:  OMNIPAQUE IOHEXOL 300 MG/ML  SOLN  COMPARISON:  None.  FINDINGS: The lung bases are clear.  The liver, gallbladder, spleen, adrenal glands, pancreas and kidneys are unremarkable.  There is no evidence of free fluid, enlarged lymph nodes, biliary dilation or abdominal aortic aneurysm.  The bowel, bladder and appendix are unremarkable. There is no evidence of bowel obstruction, abscess or pneumoperitoneum.  The uterus and adnexal regions are within normal limits.  No acute or suspicious bony abnormalities are present.  IMPRESSION: No acute or significant abnormalities.   Electronically Signed   By: Laveda Abbe M.D.   On: 06/15/2013 01:25    EKG Interpretation   None      Date: 06/14/2013  Rate: 68  Rhythm: normal sinus rhythm  QRS Axis: normal  Intervals: normal  ST/T Wave abnormalities: nonspecific T wave changes  Conduction Disutrbances:none  Narrative Interpretation:   Old EKG Reviewed: unchanged    MDM   1. Nausea and vomiting    48 year old female with nausea and vomiting with food intake over the last 2 weeks.  She hasn't palpation severe.  No abnormalities noted on EKG.  Labs are unremarkable.  Plan to check CT abdomen pelvis for possible gastric outlet obstruction.  She'll need further workup from her primary care doctor with upper endoscopy.  Maybe due to gastritis, will start her on Protonix.    Olivia Mackie, MD 06/15/13 [phone]

## 2013-06-16 LAB — POCT I-STAT TROPONIN I: Troponin i, poc: 0 ng/mL (ref 0.00–0.08)

## 2015-01-19 ENCOUNTER — Encounter: Payer: Self-pay | Admitting: Gynecology

## 2015-01-19 ENCOUNTER — Other Ambulatory Visit (HOSPITAL_COMMUNITY)
Admission: RE | Admit: 2015-01-19 | Discharge: 2015-01-19 | Disposition: A | Discharge status: Home / Self Care | Payer: Commercial Managed Care - PPO | Source: Ambulatory Visit | Attending: Gynecology | Admitting: Gynecology

## 2015-01-19 ENCOUNTER — Ambulatory Visit (INDEPENDENT_AMBULATORY_CARE_PROVIDER_SITE_OTHER): Payer: Commercial Managed Care - PPO | Admitting: Gynecology

## 2015-01-19 VITALS — BP 122/76 | Ht 62.0 in | Wt 164.0 lb

## 2015-01-19 DIAGNOSIS — Z01419 Encounter for gynecological examination (general) (routine) without abnormal findings: Secondary | ICD-10-CM

## 2015-01-19 DIAGNOSIS — B373 Candidiasis of vulva and vagina: Secondary | ICD-10-CM | POA: Diagnosis not present

## 2015-01-19 DIAGNOSIS — Z8639 Personal history of other endocrine, nutritional and metabolic disease: Secondary | ICD-10-CM

## 2015-01-19 DIAGNOSIS — R3129 Other microscopic hematuria: Secondary | ICD-10-CM

## 2015-01-19 DIAGNOSIS — R312 Other microscopic hematuria: Secondary | ICD-10-CM | POA: Diagnosis not present

## 2015-01-19 DIAGNOSIS — Z1151 Encounter for screening for human papillomavirus (HPV): Secondary | ICD-10-CM | POA: Insufficient documentation

## 2015-01-19 DIAGNOSIS — R8290 Unspecified abnormal findings in urine: Secondary | ICD-10-CM | POA: Diagnosis not present

## 2015-01-19 DIAGNOSIS — B3731 Acute candidiasis of vulva and vagina: Secondary | ICD-10-CM

## 2015-01-19 LAB — URINALYSIS W MICROSCOPIC + REFLEX CULTURE
BILIRUBIN URINE: NEGATIVE
CASTS: NONE SEEN [LPF]
CRYSTALS: NONE SEEN [HPF]
Glucose, UA: NEGATIVE
KETONES UR: NEGATIVE
Leukocytes, UA: NEGATIVE
Nitrite: NEGATIVE
PROTEIN: NEGATIVE
Specific Gravity, Urine: 1.01 (ref 1.001–1.035)
pH: 6 (ref 5.0–8.0)

## 2015-01-19 MED ORDER — FLUCONAZOLE 150 MG PO TABS: 150.0000 mg | ORAL_TABLET | Freq: Once | ORAL | Status: DC

## 2015-01-19 NOTE — Progress Notes (Signed)
Evelyn Rodgers Aug 28, 1965 [id number]   History:    49 y.o.  for annual gyn exam who has not been seen the office for over 7 years. She states she was seen her primary care physician in [facility] who sometime this year did her blood work. She mentioned that he had diagnosed her with vitamin D deficiency and she's taking 50,000 units weekly for approximately 3-4 months and is scheduled to follow-up to check her vitamin D level after she completes the recommended treatment. She mentioned that her primary care physician had noted microhematuria in the past and had referred her to the urologist at [facility] but patient did not follow through. She was having some frequency with urination and some odor today but no dysuria. Patient denied any fever, chills, nausea, vomiting or any back pain. She is using condoms for contraception at times she reports normal menstrual cycles. Patient denies any past history of any abnormal Pap smear. Patient reports normal mammogram this year.  Past medical history,surgical history, family history and social history were all reviewed and documented in the EPIC chart.  Gynecologic History Patient's last menstrual period was 01/03/2015. Contraception: condoms Last Pap: More than 5 years ago. Results were: normal Last mammogram: 2016. Results were: Patient reports it was normal  Obstetric History OB History  Gravida Para Term Preterm AB SAB TAB Ectopic Multiple Living  # Outcome Date GA Lbr Len/2nd Weight Sex Delivery Anes PTL Lv  2 Para           1 Para                ROS: A ROS was performed and pertinent positives and negatives are included in the history.  GENERAL: No fevers or chills. HEENT: No change in vision, no earache, sore throat or sinus congestion. NECK: No pain or stiffness. CARDIOVASCULAR: No chest pain or pressure. No palpitations. PULMONARY: No shortness of breath, cough or wheeze.  GASTROINTESTINAL: No abdominal pain, nausea, vomiting or diarrhea, melena or bright red blood per rectum. GENITOURINARY: No urinary frequency, urgency, hesitancy or dysuria. MUSCULOSKELETAL: No joint or muscle pain, no back pain, no recent trauma. DERMATOLOGIC: No rash, no itching, no lesions. ENDOCRINE: No polyuria, polydipsia, no heat or cold intolerance. No recent change in weight. HEMATOLOGICAL: No anemia or easy bruising or bleeding. NEUROLOGIC: No headache, seizures, numbness, tingling or weakness. PSYCHIATRIC: No depression, no loss of interest in normal activity or change in sleep pattern.     Exam: chaperone present  BP 122/76 mmHg  Ht  (1.575 m)  Wt 164 lb (74.39 kg)  BMI 29.99 kg/m2  LMP 01/03/2015  Body mass index is 29.99 kg/(m^2).  General appearance : Well developed well nourished female. No acute distress HEENT: Eyes: no retinal hemorrhage or exudates,  Neck supple, trachea midline, no carotid bruits, no thyroidmegaly Lungs: Clear to auscultation, no rhonchi or wheezes, or rib retractions  Heart: Regular rate and rhythm, no murmurs or gallops Breast:Examined in sitting and supine position were symmetrical in appearance, no palpable masses or tenderness,  no skin retraction, no nipple inversion, no nipple discharge, no skin discoloration, no axillary or supraclavicular lymphadenopathy Abdomen: no palpable masses or tenderness, no rebound or guarding Extremities: no edema or skin discoloration or tenderness  Pelvic:  Bartholin, Urethra, Skene Glands: Within normal limits             Vagina: No gross  lesions or discharge  Cervix: No gross lesions or discharge  Uterus  anteverted, normal size, shape and consistency, non-tender and mobile  Adnexa  Without masses or tenderness  Anus and perineum  normal   Rectovaginal  normal sphincter tone without palpated masses or tenderness             Hemoccult not indicated     Assessment/Plan:  49 y.o. female for annual exam  will have her Pap smear with HPV screening today. Her urinalysis demonstrated the following:  10-20 RBC Few bacteria 0-5 WBC  We'll wait for the results of the urine culture of the urine culture is negative I have asked the patient to return back next week for urinalysis. If microhematuria still present were going to refer her to a local urologist for further evaluation. Blood work was done recently by her PCP. There was some yeast noted in the urine sign point to call in a prescription for Diflucan 150 mg one by mouth. We discussed importance of monthly self breast examination. Patient declined flu vaccine.   Ok Edwards MD, 5:18 PM 01/19/2015

## 2015-01-19 NOTE — Patient Instructions (Signed)
Fluconazole tablets Qu es este medicamento? El FLUCONAZOL es un medicamento antimictico. Se utiliza para tratar ciertos tipos de infecciones micticas o por levadura. Este medicamento puede ser utilizado para otros usos; si tiene alguna pregunta consulte con su proveedor de atencin mdica o con su farmacutico. MARCAS COMERCIALES DISPONIBLES: Diflucan Qu le debo informar a mi profesional de la salud antes de tomar este medicamento? Necesita saber si usted presenta alguno de los siguientes problemas o situaciones: -antecedentes de pulso cardaco irregular -enfermedad renal -una reaccin alrgica o inusual al fluconazol, a otros azoles u otros medicamentos, alimentos, colorantes o conservantes -si est embarazada o buscando quedar embarazada -si est amamantando a un beb Cmo debo utilizar este medicamento? Tome este medicamento por va oral. Siga las instrucciones de la etiqueta del [place]. No tome su medicamento con una frecuencia mayor a la indicada. Hable con su pediatra para informarse acerca del uso de este medicamento en nios. Puede requerir atencin especial. Este medicamento ha sido recetado a nios tan menores como de 6 meses de [place]. Sobredosis: Pngase en contacto inmediatamente con un centro toxicolgico o una sala de urgencia si usted cree que haya tomado demasiado medicamento. ATENCIN: [employer] es solo para usted. No comparta este medicamento con nadie. Qu sucede si me olvido de una dosis? Si olvida una dosis, tmela lo antes posible. Si es casi la hora de la prxima dosis, tome slo esa dosis. No tome dosis adicionales o dobles. Qu puede interactuar con este medicamento? No tome esta medicina con ninguno de los siguientes medicamentos: -astemizol -ciertos medicamentos para el pulso cardiaco irregular, tales como dofetilida, dronedarona, quinidina -cisapride -eritromicina -lomitapida -otros medicamentos que prolongan el intervalo QT (causa un ritmo  cardiaco anormal) -pimozida -terfenadina -tioridazina -tolvaptn -ziprasidona Esta medicina tambin puede interactuar con los siguientes medicamentos: -medicamentos antivirales para el VIH o SIDA -pldoras anticonceptivas -ciertos antibiticos, tales como rifabutina, rifampicina -ciertos medicamentos para la presin sangunea, tales como amlodipina, isradipina, felodipino, hidroclorotiazida, losartn, nifedipina -ciertos medicamentos para el cncer, tales como ciclofosfamida, vinblastina, vincristina -ciertos medicamentos para el colesterol, tales como atorvastatina, lovastatina, fluvastatina, simvastatina -ciertos medicamentos para la depresin, ansiedad o trastornos psicticos, tales como amitriptilina, midazolam, nortriptilina, triazolam -ciertos medicamentos para la diabetes, tales como glipizida, gliburida, tolbutamida -ciertos medicamentos para Conservation officer, historic buildings, tales como alfentanilo, fentanilo, metadona -ciertos medicamentos para convulsiones, tales como carbamazepina, fenitona -ciertos medicamentos que tratan o previenen cogulos sanguneos, tales como warfarina -halofantrina -medicamentos que reducen su capacidad de combatir infecciones, tales como ciclosporina, prednisona, tacrolimo -los AINE, medicamentos para el dolor o inflamacin, tales como celecoxib, diclofenaco, flurbiprofeno, ibuprofeno, meloxicam, naproxeno -otros medicamentos para infecciones micticas -sirolims -teofilina -tofacitinib Puede ser que esta lista no menciona todas las posibles interacciones. Informe a su profesional de [employer]	[place] de [employer] productos a base de hierbas, medicamentos de [place] o suplementos nutritivos que est tomando. Si usted fuma, consume bebidas alcohlicas o si utiliza drogas ilegales, indqueselo tambin a su profesional de [employer]	[place]. Algunas sustancias pueden interactuar con su medicamento. A qu debo estar atento al usar [employer]? Visite a su mdico o a su profesional de la  salud para chequear su evolucin peridicamente. Si toma este medicamento durante un perodo de [employer], podr Customer service manager anlisis de [place]. Si los sntomas no mejoran, consulte a su mdico. Pueden transcurrir [employer] o meses de tratamiento antes de que algunas infecciones micticas se curen. El alcohol puede aumentar la posibilidad de daos al hgado. Evite consumir bebidas alcohlicas. Si tiene una infeccin vaginal, no tenga relaciones  sexuales hasta que haya completado el tratamiento. Puede usar una toallita higinica. No use tampones. Use ropa interior de algodn no sintticos, y recin lavadas. Qu efectos secundarios puedo tener al utilizar este medicamento? Efectos secundarios que debe informar a su mdico o a su profesional de la salud tan pronto como sea posible: -reacciones alrgicas como erupcin cutnea, picazn o urticarias, hinchazn de los labios, boca, lengua o garganta -orina de color amarillo oscuro -sensacin de mareos o desmayos -pulso cardiaco irregular o dolor en el pecho -enrojecimiento, formacin de ampollas, descamacin o distensin de la piel, inclusive dentro de la boca -dificultad al respirar -sangrado o magulladuras inusuales -vmito -color amarillento de los ojos o la piel Efectos secundarios que, por lo general, no requieren atencin mdica (debe informarlos a su mdico o a su profesional de la salud si persisten o si son molestos): -cambios en el sabor de los alimentos -diarrea -dolor de cabeza -malestar estomacal o nuseas Puede ser que esta lista no menciona todos los posibles efectos secundarios. Comunquese a su mdico por asesoramiento mdico sobre los efectos secundarios. Usted puede informar los efectos secundarios a la FDA por telfono al 1-800-FDA-1088. Dnde debo guardar mi medicina? Mantngala fuera del alcance de los nios. Gurdela a temperatura ambiente, a menos de 30 grados C (86 grados F). Deseche todo el medicamento  que no haya utilizado, despus de la fecha de vencimiento. ATENCIN: Este folleto es un resumen. Puede ser que no cubra toda la posible informacin. Si usted tiene preguntas acerca de esta medicina, consulte con su mdico, su farmacutico o su profesional de la salud.  2015, Elsevier/Gold Standard. (2013-01-16 14:13:08) Vaginitis monilisica (Monilial Vaginitis) La vaginitis es una inflamacin (irritacin, hinchazn) de la vagina y la vulva. Esta no es una enfermedad de transmisin sexual.  CAUSAS Este tipo de vaginitis lo causa un hongo (candida) que normalmente se encuentra en la vagina. El hongo candida se ha desarrollado hasta el punto de ocasionar problemas en el equilibrio qumico. SNTOMAS  Secrecin vaginal espesa y blanca.  Hinchazn, picazn, enrojecimiento e inflamacin de la vagina y en algunos casos de los labios vaginales (vulva).  Ardor o dolor al orinar.  Dolor en las relaciones sexuales. DIAGNSTICO Los factores que favorecen la vaginitis moniliasica son:  [place] de virginidad y postmenopusicas.  Embarazo.  Infecciones.  Sentir cansancio, estar enferma o estresada, especialmente si ya ha sufrido este problema en el pasado.  Diabetes Buen control ayudar a disminur la probabilidad.  Pldoras anticonceptivas  Ropa interior muy ajustada.  El uso de espumas de bao, aerosoles femeninos duchas vaginales o tampones con desodorante.  Algunos antibiticos (medicamentos que destruyen grmenes).  Si contrae alguna enfermedad puede sufrir recurrencias espordicas. TRATAMIENTO El profesional que lo asiste prescribir medicamentos.  Hay diferentes tipos de cremas y supositorios vaginales que tratan especficamente la vaginitis monilisica. Para infecciones por hongos recurrentes, utilice un supositorio o crema en la vagina dos veces por semana, o segn se le indique.  Tambin podrn utilizarse cremas con corticoides o anti monilisicas para la picazn o la  irritacin de la vulva. Consulte con el profesional que la asiste.  Si la crema no da resultado, podr aplicarse en la vagina una solucin con azul de metileno.  El consumo de yogur puede prevenir este tipo de vaginitis. INSTRUCCIONES PARA EL CUIDADO DOMICILIARIO  Tome todos los medicamentos tal como se le indic.  No mantenga relaciones sexuales hasta que el tratamiento se haya completado, o segn las indicaciones del profesional que la asiste.  Tome baos de   asiento tibios.  No se aplique duchas vaginales.  No utilice tampones, especialmente los perfumados.  Use ropa interior de algodn  Mirant pantalones ajustados y las medias tipo panty.  Comunique a sus compaeros sexuales que sufre una infeccin por hongos. Ellos deben concurrir para un control mdico si tienen sntomas como una urticaria leve o picazn.  Sus compaeros sexuales deben tratarse tambin si la infeccin es difcil de Pharmacologist.  Practique el sexo seguro - use condones  Algunos medicamentos vaginales ocasionan fallas en los condones de ltex. Los medicamentos vaginales que pueden daar los condones son:  Chiropodist cleocina  Butoconazole (Femstat)  Terconazole (Terazol) supositorios vaginales  Miconazole (Monistat) (es un medicamento de venta libre) SOLICITE ATENCIN MDICA SI:  Daphane Shepherd tiene una temperatura oral de ms de 38,9 C (102 F).  Si la infeccin empeora luego de 2 [address]  Si la infeccin no mejora luego de 3 [address].  Aparecen ampollas en o alrededor de la vagina.  Si aparece una hemorragia vaginal y no es el momento del perodo.  Siente dolor al [employer].  Presenta problemas intestinales.  Tiene dolor durante las [employer]. Document Released: 02/01/2005 Document Revised: 07/17/2011 [facility] Patient Information 2015 [place], [place]. This information is not intended to replace advice given to you by your health care provider. Make sure you discuss any  questions you have with your health care provider. Hematuria (Hematuria) La hematuria es la presencia de sangre en la orina. La causa puede ser una infeccin en la vejiga, en los riones, en la prstata, clculos renales o cncer de las vas urinarias. Normalmente las infecciones pueden tratarse con medicamentos, y los clculos renales, por lo general, se eliminarn a travs de la orina. Si ninguna de estas es la causa de su hematuria, quizs se necesiten ms estudios para Building services engineer. Es muy importante que le informe a su mdico si ve sangre en la [place], aunque el sangrado se detenga sin tratamiento o no cause dolor. La sangre que aparece en la orina y luego se detiene y vuelve a aparecer nuevamente puede ser un sntoma de una enfermedad muy grave. Adems, el dolor no es un sntoma en las etapas iniciales de muchos tipos de cncer urinarios. INSTRUCCIONES PARA EL CUIDADO EN EL HOGAR   Beba mucho lquido, entre 3 a Risk analyst. Si le han diagnosticado una infeccin, se recomienda especialmente el jugo de arndanos rojos, [address] de grandes cantidades de [place].  Evite la cafena, el t y las bebidas con gas porque suelen irritar la vejiga.  Evite el alcohol ya que puede Teacher, adult education.  Tome todos los [employer] se lo haya indicado el mdico.  Si le recetaron antibiticos, asegrese de terminarlos, incluso si comienza a sentirse mejor.  Si le han diagnosticado clculos renales, siga las instrucciones de su mdico con respecto a Ecologist la orina para TEFL teacher clculo.  Vace la vejiga con frecuencia. Evite retener la orina durante largos perodos.  Despus de defecar, las mujeres deben higienizarse desde adelante hacia atrs. Use solo un papel por vez.  Si es [employer], vace la vejiga antes y despus de Management consultant. SOLICITE ATENCIN MDICA SI:  Siente dolor en la espalda.  Tiene fiebre.  Tiene sensacin de Programme researcher, broadcasting/film/video (nuseas) o  vmitos.  Los sntomas no mejoran despus de [address]. Si la condicin empeora, visite al mdico antes de lo previsto. SOLICITE ATENCIN MDICA DE INMEDIATO SI:   Vomita con frecuencia e intensamente y  no puede [employer].  Siente un dolor intenso en la espalda o el abdomen incluso tomando los medicamentos.  Elimina cogulos grandes o sangre con la [place].  Se siente extremadamente dbil, se desmaya o pierde el conocimiento. ASEGRESE DE QUE:   Comprende estas instrucciones.  Controlar su afeccin.  Recibir ayuda de inmediato si no mejora o si empeora. Document Released: 04/24/2005 Document Revised: 09/08/2013 [facility] Patient Information 2015 [place], [place]. This information is not intended to replace advice given to you by your health care provider. Make sure you discuss any questions you have with your health care provider.

## 2015-01-21 LAB — URINE CULTURE
Colony Count: NO GROWTH
Organism ID, Bacteria: NO GROWTH

## 2015-01-21 LAB — CYTOLOGY - PAP

## 2015-01-27 ENCOUNTER — Other Ambulatory Visit: Payer: Commercial Managed Care - PPO

## 2015-01-27 DIAGNOSIS — R3129 Other microscopic hematuria: Secondary | ICD-10-CM

## 2015-01-28 LAB — URINALYSIS W MICROSCOPIC + REFLEX CULTURE
Bilirubin Urine: NEGATIVE
CASTS: NONE SEEN [LPF]
Crystals: NONE SEEN [HPF]
Glucose, UA: NEGATIVE
Hgb urine dipstick: NEGATIVE
Ketones, ur: NEGATIVE
Nitrite: NEGATIVE
PROTEIN: NEGATIVE
RBC / HPF: NONE SEEN RBC/HPF (ref <=2)
Specific Gravity, Urine: 1.009 (ref 1.001–1.035)
YEAST: NONE SEEN [HPF]
pH: 7.5 (ref 5.0–8.0)

## 2015-01-30 LAB — URINE CULTURE: Colony Count: 70000

## 2015-02-01 ENCOUNTER — Other Ambulatory Visit: Payer: Self-pay | Admitting: Gynecology

## 2015-02-01 MED ORDER — NITROFURANTOIN MONOHYD MACRO 100 MG PO CAPS: 100.0000 mg | ORAL_CAPSULE | Freq: Two times a day (BID) | ORAL | Status: DC

## 2015-05-21 ENCOUNTER — Encounter: Payer: Self-pay | Admitting: Gynecology

## 2015-06-08 ENCOUNTER — Encounter: Payer: Self-pay | Admitting: Gynecology

## 2015-06-08 ENCOUNTER — Ambulatory Visit (INDEPENDENT_AMBULATORY_CARE_PROVIDER_SITE_OTHER): Payer: Commercial Managed Care - PPO | Admitting: Gynecology

## 2015-06-08 VITALS — BP 132/76

## 2015-06-08 DIAGNOSIS — N912 Amenorrhea, unspecified: Secondary | ICD-10-CM | POA: Diagnosis not present

## 2015-06-08 DIAGNOSIS — Z8639 Personal history of other endocrine, nutritional and metabolic disease: Secondary | ICD-10-CM

## 2015-06-08 DIAGNOSIS — N390 Urinary tract infection, site not specified: Secondary | ICD-10-CM | POA: Diagnosis not present

## 2015-06-08 DIAGNOSIS — M6289 Other specified disorders of muscle: Secondary | ICD-10-CM | POA: Diagnosis not present

## 2015-06-08 LAB — CBC WITH DIFFERENTIAL/PLATELET
Basophils Absolute: 0.1 10*3/uL (ref 0.0–0.1)
Basophils Relative: 1 % (ref 0–1)
Eosinophils Absolute: 0.6 10*3/uL (ref 0.0–0.7)
Eosinophils Relative: 8 % — ABNORMAL HIGH (ref 0–5)
HCT: 37.5 % (ref 36.0–46.0)
HEMOGLOBIN: 12.6 g/dL (ref 12.0–15.0)
Lymphocytes Relative: 36 % (ref 12–46)
Lymphs Abs: 2.8 10*3/uL (ref 0.7–4.0)
MCH: 30.2 pg (ref 26.0–34.0)
MCHC: 33.6 g/dL (ref 30.0–36.0)
MCV: 89.9 fL (ref 78.0–100.0)
MONOS PCT: 5 % (ref 3–12)
MPV: 9.2 fL (ref 8.6–12.4)
Monocytes Absolute: 0.4 10*3/uL (ref 0.1–1.0)
NEUTROS ABS: 3.9 10*3/uL (ref 1.7–7.7)
NEUTROS PCT: 50 % (ref 43–77)
Platelets: 235 10*3/uL (ref 150–400)
RBC: 4.17 MIL/uL (ref 3.87–5.11)
RDW: 13.3 % (ref 11.5–15.5)
WBC: 7.8 10*3/uL (ref 4.0–10.5)

## 2015-06-08 LAB — URINALYSIS W MICROSCOPIC + REFLEX CULTURE
Bilirubin Urine: NEGATIVE
Casts: NONE SEEN [LPF]
Crystals: NONE SEEN [HPF]
Glucose, UA: NEGATIVE
Ketones, ur: NEGATIVE
Leukocytes, UA: NEGATIVE
NITRITE: NEGATIVE
PH: 7 (ref 5.0–8.0)
Protein, ur: NEGATIVE
SPECIFIC GRAVITY, URINE: 1.01 (ref 1.001–1.035)
YEAST: NONE SEEN [HPF]

## 2015-06-08 LAB — PREGNANCY, URINE: PREG TEST UR: NEGATIVE

## 2015-06-08 MED ORDER — MEDROXYPROGESTERONE ACETATE 10 MG PO TABS: ORAL_TABLET | ORAL | Status: AC

## 2015-06-08 NOTE — Progress Notes (Signed)
   Patient presented to the office today with several complaints.  #1 tiredness and muscle fatigue (review of patient's record indicated she's had history vitamin D deficiency in the past currently not taking any vitamin D. Patient did not return after she was prescribed vitamin D 50,000 units every weekly for 12 weeks in effort to follow-up with her vitamin D level so we will check it today. #2 patient complaining of knee pains especially when she exercises or bends over she will be referred to the orthopedic surgeon #3 secondary amenorrhea patient has not had a menstrual cycle since 04/25/2015. Patient denies any nipple discharge no unusual headaches no blurry vision. We are going to check today her TSH as well as prolactin #4 also because of her tiredness and fatigue and family history of diabetes were going to check a hemoglobin A1c along with a CBC. #5 all her she is amenorrheic but she denies any nausea, vomiting or any breast tenderness. We'll check a urine precipitous today as well #6 patient had a urinary tract infection last September review of her record indicated a message of been left on her following to pick up her antibiotic and patient does not recall. We will check a urinalysis today as well #7 patient's complained at times of memory loss she's going to be referred back to her PCP Dr. Caleb Popp  for further evaluation for possible presenile dementia.  Exam: Blood pressure 132/76 Gen. appearance well-developed we'll female no acute distress Abdomen: Soft nontender no rebound or guarding Pelvic: Bartholin urethra Skene was within normal limits Vagina: No lesions or discharge Cervix: No lesions or discharge Uterus: Anteverted normal size shape and consistency Adnexa: No palpable mass or tenderness Rectal exam not done  Urine for a test was negative  Urinalysis few bacteria culture submitted  Assessment/plan: Patient with secondary amenorrhea we'll wait for the results of  the TSH and prolactin of normal she was given a prescription of Provera 10 mg to take 1 by mouth daily for 10 days. She was given the name of dermatologist and orthopedic surgeon for follow-up based on the symptoms mentioned above. She will also follow up with her primary care physician as a result of her recurrent memory loss. We'll wait for the result of the urine culture before prescribing any antibiotics unless clinically indicated. All the above instructions were provided in Spanish.

## 2015-06-09 LAB — TSH: TSH: 3.772 u[IU]/mL (ref 0.350–4.500)

## 2015-06-09 LAB — HEMOGLOBIN A1C
Hgb A1c MFr Bld: 5.6 % (ref <5.7)
Mean Plasma Glucose: 114 mg/dL (ref <117)

## 2015-06-09 LAB — PROLACTIN: PROLACTIN: 19.8 ng/mL

## 2015-06-09 LAB — VITAMIN D 25 HYDROXY (VIT D DEFICIENCY, FRACTURES): Vit D, 25-Hydroxy: 30 ng/mL (ref 30–100)

## 2015-06-10 LAB — URINE CULTURE
COLONY COUNT: NO GROWTH
Organism ID, Bacteria: NO GROWTH

## 2016-09-20 ENCOUNTER — Encounter: Payer: Self-pay | Admitting: Gynecology

## 2019-03-07 ENCOUNTER — Other Ambulatory Visit: Payer: Self-pay

## 2019-03-07 DIAGNOSIS — Z20822 Contact with and (suspected) exposure to covid-19: Secondary | ICD-10-CM

## 2019-03-08 LAB — NOVEL CORONAVIRUS, NAA: SARS-CoV-2, NAA: DETECTED — AB

## 2021-06-20 ENCOUNTER — Ambulatory Visit
Admission: RE | Admit: 2021-06-20 | Discharge: 2021-06-20 | Disposition: A | Discharge status: Home / Self Care | Payer: BC Managed Care – PPO | Source: Ambulatory Visit | Attending: Student | Admitting: Student

## 2021-06-20 ENCOUNTER — Other Ambulatory Visit: Payer: Self-pay

## 2021-06-20 VITALS — BP 134/87 | HR 82 | Temp 99.4°F | Resp 18

## 2021-06-20 DIAGNOSIS — B349 Viral infection, unspecified: Secondary | ICD-10-CM | POA: Diagnosis not present

## 2021-06-20 MED ORDER — PROMETHAZINE-DM 6.25-15 MG/5ML PO SYRP: 5.0000 mL | ORAL_SOLUTION | Freq: Four times a day (QID) | ORAL | 0 refills | Status: AC | PRN

## 2021-06-20 NOTE — ED Triage Notes (Signed)
Pt reports having headache, congestion, and sore throat since Friday.

## 2021-06-20 NOTE — ED Provider Notes (Signed)
[facility]    CSN: [id number] Arrival date & time: 06/20/21  1107      History   Chief Complaint Chief Complaint  Patient presents with   Headache   Sore Throat   Nasal Congestion    HPI Evelyn Rodgers is a 56 y.o. female presenting with headache, congestion, sore throat for 4  days.  Medical history noncontributory. Subjective chills, has not monitored temperature at home. Denies SOB, CP, dizziness, weakness. Has attempted ibuprofen, tea with lemon. Requires work note.   HPI  History reviewed. No pertinent past medical history.  Patient Active Problem List   Diagnosis Date Noted   History of vitamin D deficiency 01/19/2015    History reviewed. No pertinent surgical history.  OB History     Gravida  2   Para  2   Term      Preterm      AB      Living  2      SAB      IAB      Ectopic      Multiple      Live Births               Home Medications    Prior to Admission medications   Medication Sig Start Date End Date Taking? Authorizing Provider  promethazine-dextromethorphan (PROMETHAZINE-DM) 6.25-15 MG/5ML syrup Take 5 mLs by mouth 4 (four) times daily as needed for cough. 06/20/21  Yes Hazel Sams, PA-C  cholecalciferol (VITAMIN D) 1000 units tablet Take 1,000 Units by mouth daily.    [provider]  ibuprofen (ADVIL,MOTRIN) 200 MG tablet Take 200-400 mg by mouth every 6 (six) hours as needed. Reported on 06/08/2015    [provider]  medroxyPROGESTERone (PROVERA) 10 MG tablet Take one daily for 10 days 06/08/15   Terrance Mass, MD    Family History Family History  Problem Relation Age of Onset   Diabetes Brother    Heart disease Paternal Aunt    Diabetes Paternal Aunt     Social History Social History   Tobacco Use   Smoking status: Never  Substance Use Topics   Alcohol use: No    Alcohol/week: 0.0 standard drinks   Drug use: No     Allergies   Patient has no known  allergies.   Review of Systems Review of Systems  Constitutional:  Positive for chills. Negative for appetite change and fever.  HENT:  Positive for congestion and sore throat. Negative for ear pain, rhinorrhea, sinus pressure and sinus pain.   Eyes:  Negative for redness and visual disturbance.  Respiratory:  Positive for cough. Negative for chest tightness, shortness of breath and wheezing.   Cardiovascular:  Negative for chest pain and palpitations.  Gastrointestinal:  Negative for abdominal pain, constipation, diarrhea, nausea and vomiting.  Genitourinary:  Negative for dysuria, frequency and urgency.  Musculoskeletal:  Positive for myalgias.  Neurological:  Negative for dizziness, weakness and headaches.  Psychiatric/Behavioral:  Negative for confusion.   All other systems reviewed and are negative.   Physical Exam Triage Vital Signs ED Triage Vitals  Enc Vitals Group     BP 06/20/21 1127 134/87     Pulse Rate 06/20/21 1127 82     Resp 06/20/21 1127 18     Temp 06/20/21 1127 99.4 F (37.4 C)     Temp Source 06/20/21 1127 Oral     SpO2 06/20/21 1127 97 %     Weight --  Height --      Head Circumference --      Peak Flow --      Pain Score 06/20/21 1131 9     Pain Loc --      Pain Edu? --      Excl. in [place]? --    No data found.  Updated Vital Signs BP 134/87 (BP Location: Right Arm)    Pulse 82    Temp 99.4 F (37.4 C) (Oral)    Resp 18    LMP 06/01/2021    SpO2 97%   Visual Acuity Right Eye Distance:   Left Eye Distance:   Bilateral Distance:    Right Eye Near:   Left Eye Near:    Bilateral Near:     Physical Exam Vitals reviewed.  Constitutional:      General: She is not in acute distress.    Appearance: Normal appearance. She is ill-appearing.  HENT:     Head: Normocephalic and atraumatic.     Right Ear: Tympanic membrane, ear canal and external ear normal. No tenderness. No middle ear effusion. There is no impacted cerumen. Tympanic membrane is  not perforated, erythematous, retracted or bulging.     Left Ear: Tympanic membrane, ear canal and external ear normal. No tenderness.  No middle ear effusion. There is no impacted cerumen. Tympanic membrane is not perforated, erythematous, retracted or bulging.     Nose: Nose normal. No congestion.     Mouth/Throat:     Mouth: Mucous membranes are moist.     Pharynx: Uvula midline. No oropharyngeal exudate or posterior oropharyngeal erythema.  Eyes:     Extraocular Movements: Extraocular movements intact.     Pupils: Pupils are equal, round, and reactive to light.  Cardiovascular:     Rate and Rhythm: Normal rate and regular rhythm.     Heart sounds: Normal heart sounds.  Pulmonary:     Effort: Pulmonary effort is normal.     Breath sounds: Normal breath sounds. No decreased breath sounds, wheezing, rhonchi or rales.  Abdominal:     Palpations: Abdomen is soft.     Tenderness: There is no abdominal tenderness. There is no guarding or rebound.  Lymphadenopathy:     Cervical: No cervical adenopathy.     Right cervical: No superficial cervical adenopathy.    Left cervical: No superficial cervical adenopathy.  Neurological:     General: No focal deficit present.     Mental Status: She is alert and oriented to person, place, and time.  Psychiatric:        Mood and Affect: Mood normal.        Behavior: Behavior normal.        Thought Content: Thought content normal.        Judgment: Judgment normal.     UC Treatments / Results  Labs (all labs ordered are listed, but only abnormal results are displayed) Labs Reviewed  COVID-19, FLU A+B NAA    EKG   Radiology No results found.  Procedures Procedures (including critical care time)  Medications Ordered in UC Medications - No data to display  Initial Impression / Assessment and Plan / UC Course  I have reviewed the triage vital signs and the nursing notes.  Pertinent labs & imaging results that were available during my  care of the patient were reviewed by me and considered in my medical decision making (see chart for details).     This patient is a very pleasant 56  y.o. year old female presenting with viral syndrome. Afebrile, nontachy. No history pulm ds. Last menstrual period 06/01/2021. States she is not pregnant or breastfeeding.  Negative home covid test. Will check a covid and influenza PCR today.  She is out of the Tamiflu window.  Promethazine DM sent. Work note provided.   ED return precautions discussed. Patient verbalizes understanding and agreement.  Spoke with this patient using language line- [id number].  Final Clinical Impressions(s) / UC Diagnoses   Final diagnoses:  Viral syndrome     Discharge Instructions      -Promethazine DM cough syrup for congestion/cough. This could make you drowsy, so take at night before bed. -You can also take over-the-counter medications like Mucinex. -With a virus, you're typically contagious for 5-7 days, or as long as you're having fevers.       ED Prescriptions     Medication Sig Dispense Auth. Provider   promethazine-dextromethorphan (PROMETHAZINE-DM) 6.25-15 MG/5ML syrup Take 5 mLs by mouth 4 (four) times daily as needed for cough. 118 mL Hazel Sams, PA-C      PDMP not reviewed this encounter.   Hazel Sams, PA-C 06/20/21 1211

## 2021-06-20 NOTE — Discharge Instructions (Addendum)
-  Promethazine DM cough syrup for congestion/cough. This could make you drowsy, so take at night before bed. -You can also take over-the-counter medications like Mucinex. -With a virus, you're typically contagious for 5-7 days, or as long as you're having fevers.

## 2021-06-22 ENCOUNTER — Ambulatory Visit
Admission: EM | Admit: 2021-06-22 | Discharge: 2021-06-22 | Disposition: A | Discharge status: Home / Self Care | Payer: BC Managed Care – PPO

## 2021-06-22 ENCOUNTER — Emergency Department (HOSPITAL_BASED_OUTPATIENT_CLINIC_OR_DEPARTMENT_OTHER): Payer: BC Managed Care – PPO | Admitting: Radiology

## 2021-06-22 ENCOUNTER — Other Ambulatory Visit: Payer: Self-pay

## 2021-06-22 ENCOUNTER — Emergency Department (HOSPITAL_BASED_OUTPATIENT_CLINIC_OR_DEPARTMENT_OTHER)
Admission: EM | Admit: 2021-06-22 | Discharge: 2021-06-22 | Disposition: A | Discharge status: Home / Self Care | Payer: BC Managed Care – PPO | Attending: Emergency Medicine | Admitting: Emergency Medicine

## 2021-06-22 ENCOUNTER — Encounter (HOSPITAL_BASED_OUTPATIENT_CLINIC_OR_DEPARTMENT_OTHER): Payer: Self-pay

## 2021-06-22 DIAGNOSIS — J22 Unspecified acute lower respiratory infection: Secondary | ICD-10-CM | POA: Diagnosis not present

## 2021-06-22 DIAGNOSIS — R042 Hemoptysis: Secondary | ICD-10-CM

## 2021-06-22 DIAGNOSIS — R059 Cough, unspecified: Secondary | ICD-10-CM | POA: Diagnosis present

## 2021-06-22 DIAGNOSIS — R0602 Shortness of breath: Secondary | ICD-10-CM | POA: Diagnosis not present

## 2021-06-22 DIAGNOSIS — B349 Viral infection, unspecified: Secondary | ICD-10-CM

## 2021-06-22 DIAGNOSIS — Z20822 Contact with and (suspected) exposure to covid-19: Secondary | ICD-10-CM | POA: Diagnosis not present

## 2021-06-22 LAB — RESP PANEL BY RT-PCR (FLU A&B, COVID) ARPGX2
Influenza A by PCR: NEGATIVE
Influenza B by PCR: NEGATIVE
SARS Coronavirus 2 by RT PCR: NEGATIVE

## 2021-06-22 LAB — COVID-19, FLU A+B NAA
Influenza A, NAA: NOT DETECTED
Influenza B, NAA: NOT DETECTED
SARS-CoV-2, NAA: NOT DETECTED

## 2021-06-22 MED ORDER — PREDNISONE 10 MG (21) PO TBPK: ORAL_TABLET | ORAL | 0 refills | Status: AC

## 2021-06-22 MED ORDER — DOXYCYCLINE HYCLATE 100 MG PO CAPS: 100.0000 mg | ORAL_CAPSULE | Freq: Two times a day (BID) | ORAL | 0 refills | Status: AC

## 2021-06-22 MED ORDER — BENZONATATE 100 MG PO CAPS: 100.0000 mg | ORAL_CAPSULE | Freq: Three times a day (TID) | ORAL | 0 refills | Status: AC

## 2021-06-22 NOTE — ED Provider Notes (Signed)
[facility] EMERGENCY DEPT  Provider Note  CSN: [id number] Arrival date & time: 06/22/21 1647  History Chief Complaint  Patient presents with   Cough    Evelyn Rodgers is a 56 y.o. female with no significant PMH has had about a week of persistent cough, intermittent fevers, body aches and general malaise. She reports some flecks of blood in her sputum in the last few days. Was seen at UC earlier this week where Covid/Flu testing was negative. Given cough syrup which she reports made her symptoms worse. She was there again today and then referred to the ED for evaluation. She had some vomiting yesterday but none today.    Home Medications Prior to Admission medications   Medication Sig Start Date End Date Taking? Authorizing Provider  benzonatate (TESSALON) 100 MG capsule Take 1 capsule (100 mg total) by mouth every 8 (eight) hours. 06/22/21  Yes Pollyann Savoy, MD  doxycycline (VIBRAMYCIN) 100 MG capsule Take 1 capsule (100 mg total) by mouth 2 (two) times daily. 06/22/21  Yes Pollyann Savoy, MD  predniSONE (STERAPRED UNI-PAK 21 TAB) 10 MG (21) TBPK tablet 10mg  Tabs, 6 day taper. Use as directed 06/22/21  Yes 06/24/21, MD  cholecalciferol (VITAMIN D) 1000 units tablet Take 1,000 Units by mouth daily.    [provider]  ibuprofen (ADVIL,MOTRIN) 200 MG tablet Take 200-400 mg by mouth every 6 (six) hours as needed. Reported on 06/08/2015    [provider]  medroxyPROGESTERone (PROVERA) 10 MG tablet Take one daily for 10 days 06/08/15   06/10/15, MD  promethazine-dextromethorphan (PROMETHAZINE-DM) 6.25-15 MG/5ML syrup Take 5 mLs by mouth 4 (four) times daily as needed for cough. 06/20/21   06/22/21, PA-C     Allergies    Patient has no known allergies.   Review of Systems   Review of Systems Please see HPI for pertinent positives and negatives  Physical Exam BP 118/82    Pulse 88    Temp 99 F (37.2 C)    Resp 13     Ht 5\' 2"  (1.575 m)    Wt 74.4 kg    LMP 06/01/2021    SpO2 100%    BMI 30.00 kg/m   Physical Exam Vitals and nursing note reviewed.  Constitutional:      Appearance: Normal appearance.  HENT:     Head: Normocephalic and atraumatic.     Nose: Nose normal.     Mouth/Throat:     Mouth: Mucous membranes are moist.  Eyes:     Extraocular Movements: Extraocular movements intact.     Conjunctiva/sclera: Conjunctivae normal.  Cardiovascular:     Rate and Rhythm: Normal rate.  Pulmonary:     Effort: Pulmonary effort is normal.     Breath sounds: Rhonchi present. No wheezing or rales.  Abdominal:     General: Abdomen is flat.     Palpations: Abdomen is soft.     Tenderness: There is no abdominal tenderness.  Musculoskeletal:        General: No swelling. Normal range of motion.     Cervical back: Neck supple.  Skin:    General: Skin is warm and dry.  Neurological:     General: No focal deficit present.     Mental Status: She is alert.  Psychiatric:        Mood and Affect: Mood normal.    ED Results / Procedures / Treatments   EKG EKG Interpretation  Date/Time:  Wednesday June 22 2021 16:58:30 EST Ventricular Rate:  96 PR Interval:  138 QRS Duration: 70 QT Interval:  342 QTC Calculation: 432 R Axis:   66 Text Interpretation: Normal sinus rhythm Nonspecific T wave abnormality Abnormal ECG When compared with ECG of 14-Jun-2013 23:18, Rate faster Confirmed by Susy Frizzle [phone]) on 06/22/2021 6:22:18 PM  Procedures Procedures  Medications Ordered in the ED Medications - No data to display  Initial Impression and Plan  Patient well appearing, non-toxic with normal vitals has had symtpoms consistent with bronchitis vs lower respiratory infection for about a week. Not imrpoving with conservative management. Had a CXR done here. I personally viewed the images from radiology studies and agree with radiologist interpretation: no acute process. Covid/flu are negative. EKG  is unremarkable. Given presenting complaint, I considered that admission might be necessary. After review of results from ED lab and/or imaging studies, admission to the hospital is not indicated at this time. Will d/c with Rx for doxycycline, tessalon and pred-pak given duration of symptoms. Advised to RTED if symptoms worsen in the next few days.    ED Course       MDM Rules/Calculators/A&P Medical Decision Making Problems Addressed: Lower respiratory tract infection: acute illness or injury with systemic symptoms that poses a threat to life or bodily functions  Amount and/or Complexity of Data Reviewed Radiology: ordered and independent interpretation performed. Decision-making details documented in ED Course. ECG/medicine tests: ordered and independent interpretation performed. Decision-making details documented in ED Course.  Risk Prescription drug management. Decision regarding hospitalization.    Final Clinical Impression(s) / ED Diagnoses Final diagnoses:  Lower respiratory tract infection    Rx / DC Orders ED Discharge Orders          Ordered    predniSONE (STERAPRED UNI-PAK 21 TAB) 10 MG (21) TBPK tablet        06/22/21 1821    doxycycline (VIBRAMYCIN) 100 MG capsule  2 times daily        06/22/21 1821    benzonatate (TESSALON) 100 MG capsule  Every 8 hours        06/22/21 1821             Pollyann Savoy, MD 06/22/21 Rickey Primus

## 2021-06-22 NOTE — ED Triage Notes (Signed)
Patient here POV from UC with Cough.  Patient endorses Cough since Friday. Associated with Mid Chest and Back Pain. Also endorses Nasal Drainage.   No Diarrhea. Moderate Nausea. 1-2 Episodes of Emesis.   Seen at [facility] and sent for Evaluation.  Coughing in Triage. A&Ox4. GCS 15. Ambulatory.

## 2021-06-22 NOTE — ED Triage Notes (Signed)
Pt c/o cough, chest pain, fatigue, back pain, sore throat, nasal bleeding sometimes, mild ear ache and in eyes, headaches, emesis last night, coughing blood, night sweats, left eye pinkness   Denies diarrhea, constipation  States seen at another UC recently tested (-) for covid and flu.   Onset ~ Friday

## 2021-06-22 NOTE — Discharge Instructions (Signed)
Please go to the emergency department as soon as you leave urgent care for further evaluation and management. ?

## 2021-06-22 NOTE — ED Provider Notes (Signed)
[facility]    CSN: [id number] Arrival date & time: 06/22/21  1552      History   Chief Complaint Chief Complaint  Patient presents with   situation(s)    HPI Evelyn Rodgers is a 56 y.o. female.   Patient presents with nasal congestion, cough, shortness of breath, coughing up blood, sore throat, body aches, nausea with vomiting, night sweats that started approximately 5 days ago.  Patient was seen at a different urgent care on 06/20/2021 and was prescribed Promethazine DM with no improvement in symptoms.  COVID and flu test was negative at that time.  Denies any known fevers at home.  Denies any known sick contacts.  Denies history of asthma or COPD and patient is not a smoker.    History reviewed. No pertinent past medical history.  Patient Active Problem List   Diagnosis Date Noted   History of vitamin D deficiency 01/19/2015    History reviewed. No pertinent surgical history.  OB History     Gravida  2   Para  2   Term      Preterm      AB      Living  2      SAB      IAB      Ectopic      Multiple      Live Births               Home Medications    Prior to Admission medications   Medication Sig Start Date End Date Taking? Authorizing Provider  cholecalciferol (VITAMIN D) 1000 units tablet Take 1,000 Units by mouth daily.    [provider]  ibuprofen (ADVIL,MOTRIN) 200 MG tablet Take 200-400 mg by mouth every 6 (six) hours as needed. Reported on 06/08/2015    [provider]  medroxyPROGESTERone (PROVERA) 10 MG tablet Take one daily for 10 days 06/08/15   Ok Edwards, MD  promethazine-dextromethorphan (PROMETHAZINE-DM) 6.25-15 MG/5ML syrup Take 5 mLs by mouth 4 (four) times daily as needed for cough. 06/20/21   Rhys Martini, PA-C    Family History Family History  Problem Relation Age of Onset   Diabetes Brother    Heart disease Paternal Aunt    Diabetes Paternal Aunt     Social  History Social History   Tobacco Use   Smoking status: Never  Substance Use Topics   Alcohol use: No    Alcohol/week: 0.0 standard drinks   Drug use: No     Allergies   Patient has no known allergies.   Review of Systems Review of Systems Per HPI  Physical Exam Triage Vital Signs ED Triage Vitals  Enc Vitals Group     BP 06/22/21 1611 130/86     Pulse Rate 06/22/21 1611 87     Resp 06/22/21 1611 18     Temp 06/22/21 1611 99.3 F (37.4 C)     Temp Source 06/22/21 1611 Oral     SpO2 06/22/21 1611 98 %     Weight --      Height --      Head Circumference --      Peak Flow --      Pain Score 06/22/21 1612 0     Pain Loc --      Pain Edu? --      Excl. in [place]? --    No data found.  Updated Vital Signs BP 130/86 (BP Location: Left Arm)  Pulse 87    Temp 99.3 F (37.4 C) (Oral)    Resp 18    LMP 06/01/2021    SpO2 98%   Visual Acuity Right Eye Distance:   Left Eye Distance:   Bilateral Distance:    Right Eye Near:   Left Eye Near:    Bilateral Near:     Physical Exam Constitutional:      General: She is not in acute distress.    Appearance: Normal appearance. She is ill-appearing. She is not toxic-appearing or diaphoretic.  HENT:     Head: Normocephalic and atraumatic.     Right Ear: Tympanic membrane and ear canal normal.     Left Ear: Tympanic membrane and ear canal normal.     Nose: Congestion present.     Mouth/Throat:     Mouth: Mucous membranes are moist.     Pharynx: No posterior oropharyngeal erythema.  Eyes:     Extraocular Movements: Extraocular movements intact.     Conjunctiva/sclera: Conjunctivae normal.     Pupils: Pupils are equal, round, and reactive to light.  Cardiovascular:     Rate and Rhythm: Normal rate and regular rhythm.     Pulses: Normal pulses.     Heart sounds: Normal heart sounds.  Pulmonary:     Effort: Pulmonary effort is normal. No respiratory distress.     Breath sounds: Normal breath sounds. No stridor. No  wheezing, rhonchi or rales.  Abdominal:     General: Abdomen is flat. Bowel sounds are normal.     Palpations: Abdomen is soft.  Musculoskeletal:        General: Normal range of motion.     Cervical back: Normal range of motion.  Skin:    General: Skin is warm and dry.  Neurological:     General: No focal deficit present.     Mental Status: She is alert and oriented to person, place, and time. Mental status is at baseline.  Psychiatric:        Mood and Affect: Mood normal.        Behavior: Behavior normal.     UC Treatments / Results  Labs (all labs ordered are listed, but only abnormal results are displayed) Labs Reviewed - No data to display  EKG   Radiology No results found.  Procedures Procedures (including critical care time)  Medications Ordered in UC Medications - No data to display  Initial Impression / Assessment and Plan / UC Course  I have reviewed the triage vital signs and the nursing notes.  Pertinent labs & imaging results that were available during my care of the patient were reviewed by me and considered in my medical decision making (see chart for details).     Due to the patient's appearance on physical exam and patient's report of hemoptysis, patient was advised to go to the hospital for more extensive evaluation and management.  Patient was agreeable with plan.  Vital signs stable at discharge.  Agree with patient self transport to the hospital.  Patient declined interpreter and wished for her daughter to interpret. Final Clinical Impressions(s) / UC Diagnoses   Final diagnoses:  Viral illness  Hemoptysis  Shortness of breath     Discharge Instructions      Please go to the emergency department as soon as you leave urgent care for further evaluation and management.    ED Prescriptions   None    PDMP not reviewed this encounter.   Gustavus Bryant, [place]  06/22/21 1628 ° °
